# Patient Record
Sex: Male | Born: 1995 | Race: White | Hispanic: No | Marital: Single | Smoking: Never smoker
Health system: Southern US, Community
[De-identification: ages and names within clinical notes are randomized; demographics above are authoritative.]

## PROBLEM LIST (undated history)

## (undated) DIAGNOSIS — F909 Attention-deficit hyperactivity disorder, unspecified type: Secondary | ICD-10-CM

## (undated) DIAGNOSIS — F419 Anxiety disorder, unspecified: Secondary | ICD-10-CM

## (undated) HISTORY — DX: Attention-deficit hyperactivity disorder, unspecified type: F90.9

## (undated) HISTORY — DX: Anxiety disorder, unspecified: F41.9

---
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
## 2016-06-13 ENCOUNTER — Encounter (HOSPITAL_COMMUNITY)
Admission: EM | Disposition: A | Discharge status: Home / Self Care | Payer: Self-pay | Source: Home / Self Care | Attending: Emergency Medicine

## 2016-06-13 ENCOUNTER — Ambulatory Visit (HOSPITAL_COMMUNITY)
Admission: EM | Admit: 2016-06-13 | Discharge: 2016-06-14 | Disposition: A | Discharge status: Home / Self Care | Payer: Managed Care, Other (non HMO) | Attending: Emergency Medicine | Admitting: Emergency Medicine

## 2016-06-13 ENCOUNTER — Encounter (HOSPITAL_COMMUNITY): Payer: Self-pay | Admitting: Emergency Medicine

## 2016-06-13 ENCOUNTER — Emergency Department (HOSPITAL_COMMUNITY): Payer: Managed Care, Other (non HMO)

## 2016-06-13 DIAGNOSIS — F17290 Nicotine dependence, other tobacco product, uncomplicated: Secondary | ICD-10-CM | POA: Insufficient documentation

## 2016-06-13 DIAGNOSIS — S63054A Dislocation of other carpometacarpal joint of right hand, initial encounter: Secondary | ICD-10-CM | POA: Diagnosis present

## 2016-06-13 DIAGNOSIS — F909 Attention-deficit hyperactivity disorder, unspecified type: Secondary | ICD-10-CM | POA: Diagnosis not present

## 2016-06-13 DIAGNOSIS — W228XXA Striking against or struck by other objects, initial encounter: Secondary | ICD-10-CM | POA: Diagnosis not present

## 2016-06-13 HISTORY — PX: PERCUTANEOUS PINNING: SHX2209

## 2016-06-13 LAB — I-STAT CHEM 8, ED
BUN: 5 mg/dL — ABNORMAL LOW (ref 6–20)
CREATININE: 0.9 mg/dL (ref 0.61–1.24)
Calcium, Ion: 1.11 mmol/L — ABNORMAL LOW (ref 1.15–1.40)
Chloride: 103 mmol/L (ref 101–111)
GLUCOSE: 100 mg/dL — AB (ref 65–99)
HEMATOCRIT: 47 % (ref 39.0–52.0)
HEMOGLOBIN: 16 g/dL (ref 13.0–17.0)
Potassium: 3.6 mmol/L (ref 3.5–5.1)
Sodium: 140 mmol/L (ref 135–145)
TCO2: 25 mmol/L (ref 0–100)

## 2016-06-13 LAB — CBC WITH DIFFERENTIAL/PLATELET
BASOS ABS: 0 10*3/uL (ref 0.0–0.1)
Basophils Relative: 0 %
EOS ABS: 0.1 10*3/uL (ref 0.0–0.7)
EOS PCT: 1 %
HCT: 44.8 % (ref 39.0–52.0)
Hemoglobin: 16.1 g/dL (ref 13.0–17.0)
Lymphocytes Relative: 39 %
Lymphs Abs: 3.3 10*3/uL (ref 0.7–4.0)
MCH: 30.3 pg (ref 26.0–34.0)
MCHC: 35.9 g/dL (ref 30.0–36.0)
MCV: 84.4 fL (ref 78.0–100.0)
MONO ABS: 0.6 10*3/uL (ref 0.1–1.0)
MONOS PCT: 7 %
NEUTROS ABS: 4.5 10*3/uL (ref 1.7–7.7)
Neutrophils Relative %: 53 %
PLATELETS: 361 10*3/uL (ref 150–400)
RBC: 5.31 MIL/uL (ref 4.22–5.81)
RDW: 12.6 % (ref 11.5–15.5)
WBC: 8.5 10*3/uL (ref 4.0–10.5)

## 2016-06-13 LAB — ETHANOL: Alcohol, Ethyl (B): 115 mg/dL — ABNORMAL HIGH (ref <5)

## 2016-06-13 SURGERY — PINNING, EXTREMITY, PERCUTANEOUS
Anesthesia: General | Site: Hand | Laterality: Right

## 2016-06-13 MED ORDER — MIDAZOLAM HCL 2 MG/2ML IJ SOLN
INTRAMUSCULAR | Status: AC
Start: 2016-06-13 — End: 2016-06-13
  Filled 2016-06-13: qty 2

## 2016-06-13 MED ORDER — LIDOCAINE 2% (20 MG/ML) 5 ML SYRINGE
INTRAMUSCULAR | Status: AC
  Filled 2016-06-13: qty 5

## 2016-06-13 MED ORDER — PROPOFOL 10 MG/ML IV BOLUS
INTRAVENOUS | Status: AC
  Filled 2016-06-13: qty 20

## 2016-06-13 MED ORDER — SODIUM CHLORIDE 0.9 % IJ SOLN
INTRAMUSCULAR | Status: AC
  Filled 2016-06-13: qty 10

## 2016-06-13 MED ORDER — SUFENTANIL CITRATE 50 MCG/ML IV SOLN
INTRAVENOUS | Status: AC
  Filled 2016-06-13: qty 1

## 2016-06-13 MED ORDER — IBUPROFEN 400 MG PO TABS
ORAL_TABLET | ORAL | Status: AC
  Filled 2016-06-13: qty 1

## 2016-06-13 MED ORDER — SUCCINYLCHOLINE CHLORIDE 200 MG/10ML IV SOSY
PREFILLED_SYRINGE | INTRAVENOUS | Status: AC
  Filled 2016-06-13: qty 10

## 2016-06-13 MED ORDER — ONDANSETRON HCL 4 MG/2ML IJ SOLN
INTRAMUSCULAR | Status: AC
  Filled 2016-06-13: qty 2

## 2016-06-13 MED ORDER — IBUPROFEN 400 MG PO TABS
400.0000 mg | ORAL_TABLET | Freq: Once | ORAL | Status: AC | PRN
  Administered 2016-06-13: 400 mg via ORAL

## 2016-06-13 SURGICAL SUPPLY — 20 items
BANDAGE ACE 4X5 VEL STRL LF (GAUZE/BANDAGES/DRESSINGS) ×4 IMPLANT
GAUZE SPONGE 4X4 12PLY STRL (GAUZE/BANDAGES/DRESSINGS) ×3 IMPLANT
GLOVE BIOGEL M 8.0 STRL (GLOVE) ×3 IMPLANT
GOWN STRL REUS W/ TWL LRG LVL3 (GOWN DISPOSABLE) ×2 IMPLANT
GOWN STRL REUS W/TWL LRG LVL3 (GOWN DISPOSABLE) ×6
KIT BASIN OR (CUSTOM PROCEDURE TRAY) ×3 IMPLANT
KIT ROOM TURNOVER OR (KITS) ×3 IMPLANT
NDL HYPO 25GX1X1/2 BEV (NEEDLE) IMPLANT
NEEDLE HYPO 25GX1X1/2 BEV (NEEDLE) ×6 IMPLANT
PACK ORTHO EXTREMITY (CUSTOM PROCEDURE TRAY) ×3 IMPLANT
PAD ARMBOARD 7.5X6 YLW CONV (MISCELLANEOUS) ×4 IMPLANT
PAD CAST 4YDX4 CTTN HI CHSV (CAST SUPPLIES) IMPLANT
PADDING CAST COTTON 4X4 STRL (CAST SUPPLIES) ×6
SLING ARM FOAM STRAP LRG (SOFTGOODS) ×2 IMPLANT
SOAP 2 % CHG 4 OZ (WOUND CARE) ×3 IMPLANT
SPLINT FIBERGLASS 4X30 (CAST SUPPLIES) ×2 IMPLANT
SPONGE LAP 4X18 X RAY DECT (DISPOSABLE) ×3 IMPLANT
TOWEL OR 17X24 6PK STRL BLUE (TOWEL DISPOSABLE) ×3 IMPLANT
TUBE CONNECTING 12'X1/4 (SUCTIONS) ×1
TUBE CONNECTING 12X1/4 (SUCTIONS) ×2 IMPLANT

## 2016-06-13 NOTE — Anesthesia Preprocedure Evaluation (Addendum)
Anesthesia Evaluation  Patient identified by MRN, date of birth, ID band Patient awake    Reviewed: Allergy & Precautions, NPO status , Patient's Chart, lab work & pertinent test results  Airway Mallampati: I  TM Distance: >3 FB Neck ROM: Full    Dental   Pulmonary    Pulmonary exam normal        Cardiovascular Normal cardiovascular exam     Neuro/Psych Anxiety    GI/Hepatic   Endo/Other    Renal/GU      Musculoskeletal   Abdominal   Peds  Hematology   Anesthesia Other Findings   Reproductive/Obstetrics                             Anesthesia Physical Anesthesia Plan  ASA: II and emergent  Anesthesia Plan: General   Post-op Pain Management:  Regional for Post-op pain   Induction: Intravenous, Rapid sequence and Cricoid pressure planned  Airway Management Planned: Oral ETT  Additional Equipment:   Intra-op Plan:   Post-operative Plan: Extubation in OR  Informed Consent: I have reviewed the patients History and Physical, chart, labs and discussed the procedure including the risks, benefits and alternatives for the proposed anesthesia with the patient or authorized representative who has indicated his/her understanding and acceptance.     Plan Discussed with: CRNA and Surgeon  Anesthesia Plan Comments:        Anesthesia Quick Evaluation

## 2016-06-13 NOTE — ED Notes (Signed)
Pt prefers to NOT have any opiates.

## 2016-06-13 NOTE — ED Triage Notes (Signed)
Pt st's he has been drinking alcohol since this am.  Pt st's he got upset and hit a wall   Pt has deformity to right hand

## 2016-06-13 NOTE — ED Notes (Signed)
Patient transported to X-ray 

## 2016-06-13 NOTE — ED Provider Notes (Signed)
[facility]-EMERGENCY DEPT Provider Note   CSN: [id number] Arrival date & time: 06/13/16  1745     History   Chief Complaint Chief Complaint  Patient presents with  . Hand Injury    HPI Hunter Dawson is a 21 y.o. male.  HPI   Hunter Dawson is a 21 y.o. male, with a history of Anxiety, presenting to the ED with right hand injury. Patient states that he punched a wall around 5 PM today. He states he has been drinking alcohol all day and got upset, which made him a wall. His pain is centered around the posterior right wrist. Patient's pain was initially 10 out of 10, but has been reduced to 6 out of 10 with ibuprofen. He denies illicit drug use. He denies SI or HI. Endorses some paresthesias to the right hand. He denies other injuries, LOC, falls, or any other complaints.  Last food was around noon today.  Past Medical History:  Diagnosis Date  . ADHD (attention deficit hyperactivity disorder)   . Anxiety     Patient Active Problem List   Diagnosis Date Noted  . ADD (attention deficit disorder) 02/18/2013  . Generalized anxiety disorder 02/18/2013    History reviewed. No pertinent surgical history.     Home Medications    Prior to Admission medications   Medication Sig Start Date End Date Taking? Authorizing Provider  sertraline (ZOLOFT) 50 MG tablet Take 1 tablet (50 mg total) by mouth daily. 02/14/13 02/14/14  Loree Fee, PA-C    Family History No family history on file.  Social History Social History  Substance Use Topics  . Smoking status: Never Smoker  . Smokeless tobacco: Current User  . Alcohol use Yes     Allergies   Other   Review of Systems Review of Systems  Musculoskeletal: Positive for arthralgias and joint swelling.  Neurological: Negative for weakness.     Physical Exam Updated Vital Signs BP (!) 155/107 (BP Location: Left Arm)   Pulse (!) 102   Temp 97.8 F (36.6 C) (Oral)   Resp 20   Ht  (1.753 m)   Wt 79.4 kg   SpO2  99%   BMI 25.84 kg/m   Physical Exam  Constitutional: He appears well-developed and well-nourished. No distress.  HENT:  Head: Normocephalic and atraumatic.  Eyes: Conjunctivae are normal.  Neck: Neck supple.  Cardiovascular: Normal rate, regular rhythm and intact distal pulses.   Pulmonary/Chest: Effort normal.  Musculoskeletal: He exhibits edema and tenderness.  Tenderness and swelling to the anterior right wrist into the proximal right hand. Range of motion in the right wrist limited by pain and swelling.  Neurological: He is alert.  Right hand: Patient can make a fist and fully extend and flex his fingers. He can touch his thumb to each finger tip, except for the small finger. He has some difficulty adducting and abducting his fingers, which he states is due to pain. He endorses some paresthesias to the fingertips of each finger in his right hand.  Skin: Skin is warm and dry. Capillary refill takes less than 2 seconds. He is not diaphoretic.  Psychiatric: He has a normal mood and affect. His behavior is normal.  Nursing note and vitals reviewed.    ED Treatments / Results  Labs (all labs ordered are listed, but only abnormal results are displayed) Labs Reviewed  ETHANOL - Abnormal; Notable for the following:       Result Value   Alcohol, Ethyl (B)  115 (*)    All other components within normal limits  I-STAT CHEM 8, ED - Abnormal; Notable for the following:    BUN 5 (*)    Glucose, Bld 100 (*)    Calcium, Ion 1.11 (*)    All other components within normal limits  CBC WITH DIFFERENTIAL/PLATELET    EKG  EKG Interpretation None       Radiology Dg Wrist Complete Right  Result Date: 06/13/2016 CLINICAL DATA:  Hit a wall with pain and swelling EXAM: RIGHT WRIST - COMPLETE 3+ VIEW COMPARISON:  None. FINDINGS: Questionable old fifth metacarpal deformity. No acute fracture is seen. Apparent dorsal subluxation of the second through fifth metacarpals with respect to the carpal  bones. IMPRESSION: Abnormal alignment at the second through fifth CMC joints with metacarpals positioned dorsal to the carpal bones. Electronically Signed   By: Jasmine Pang M.D.   On: 06/13/2016 20:11   Dg Hand Complete Right  Result Date: 06/13/2016 CLINICAL DATA:  Hit a wall with wrist and hand pain and swelling EXAM: RIGHT HAND - COMPLETE 3+ VIEW COMPARISON:  None. FINDINGS: Abnormal alignment at the second through fifth CMC joints. There is dorsal dislocation of the second through fifth metacarpals with respect to the carpal bones. IMPRESSION: Apparent dorsal dislocation at the second through fifth [facility] joints Electronically Signed   By: Jasmine Pang M.D.   On: 06/13/2016 20:13    Procedures Procedures (including critical care time)  Medications Ordered in ED Medications  ibuprofen (ADVIL,MOTRIN) tablet 400 mg (400 mg Oral Given 06/13/16 1828)     Initial Impression / Assessment and Plan / ED Course  I have reviewed the triage vital signs and the nursing notes.  Pertinent labs & imaging results that were available during my care of the patient were reviewed by me and considered in my medical decision making (see chart for details).  Clinical Course as of Jun 13 2309  Sun Jun 13, 2016  2138 Spoke with Dr. Izora Ribas, who states he can come down to reduce the hand if we can do the conscious sedation. Told him we would need to draw an ethanol level first. Dr. Izora Ribas asks for [place] to call him back when we decide if we can do the sedation.  [SJ]  2225 Spoke with Dr. Izora Ribas again, who states he will be taking the patient to the OR for reduction. Requests that we prep the patient and obtain basic labs.  [SJ]    Clinical Course User Index [SJ] Anselm Pancoast, PA-C    Patient presents with a right hand injury. Dislocation of CMC joints 2 through 5. Hand consult. Patient to OR.  Vitals:   06/13/16 1937 06/13/16 1945 06/13/16 2000 06/13/16 2051  BP:  123/81 (!) 135/95 (!) 134/93  Pulse:  94 (!) 103  97  Resp:   18 16  Temp:      TempSrc:      SpO2: 99% 97% 98% 98%  Weight:      Height:         Final Clinical Impressions(s) / ED Diagnoses   Final diagnoses:  Dislocation of carpometacarpal joint of right hand, initial encounter    New Prescriptions New Prescriptions   No medications on file     Concepcion Living 06/13/16 2311    Lavera Guise, MD 06/13/16 [phone]

## 2016-06-14 ENCOUNTER — Encounter (HOSPITAL_COMMUNITY): Payer: Self-pay | Admitting: General Surgery

## 2016-06-14 ENCOUNTER — Emergency Department (HOSPITAL_COMMUNITY): Payer: Managed Care, Other (non HMO) | Admitting: Anesthesiology

## 2016-06-14 MED ORDER — PROPOFOL 10 MG/ML IV BOLUS
INTRAVENOUS | Status: DC | PRN
  Administered 2016-06-14: 120 mg via INTRAVENOUS

## 2016-06-14 MED ORDER — BUPIVACAINE HCL (PF) 0.25 % IJ SOLN
INTRAMUSCULAR | Status: AC
Start: 2016-06-14 — End: 2016-06-14
  Filled 2016-06-14: qty 30

## 2016-06-14 MED ORDER — PROPOFOL 1000 MG/100ML IV EMUL
INTRAVENOUS | Status: AC
  Filled 2016-06-14: qty 100

## 2016-06-14 MED ORDER — DOUBLE ANTIBIOTIC 500-10000 UNIT/GM EX OINT
TOPICAL_OINTMENT | CUTANEOUS | Status: AC
  Filled 2016-06-14: qty 1

## 2016-06-14 MED ORDER — LACTATED RINGERS IV SOLN
INTRAVENOUS | Status: DC | PRN
  Administered 2016-06-13: via INTRAVENOUS

## 2016-06-14 MED ORDER — ONDANSETRON HCL 4 MG/2ML IJ SOLN
INTRAMUSCULAR | Status: DC | PRN
  Administered 2016-06-14: 4 mg via INTRAVENOUS

## 2016-06-14 MED ORDER — TRAMADOL HCL 50 MG PO TABS: 100.0000 mg | ORAL_TABLET | Freq: Four times a day (QID) | ORAL | 0 refills | Status: DC | PRN

## 2016-06-14 MED ORDER — BUPIVACAINE-EPINEPHRINE (PF) 0.5% -1:200000 IJ SOLN
INTRAMUSCULAR | Status: DC | PRN
  Administered 2016-06-14: 30 mL via PERINEURAL

## 2016-06-14 MED ORDER — SUCCINYLCHOLINE CHLORIDE 20 MG/ML IJ SOLN
INTRAMUSCULAR | Status: DC | PRN
  Administered 2016-06-14: 140 mg via INTRAVENOUS

## 2016-06-14 MED ORDER — LACTATED RINGERS IV SOLN: INTRAVENOUS | Status: DC | PRN

## 2016-06-14 MED ORDER — MIDAZOLAM HCL 2 MG/2ML IJ SOLN
INTRAMUSCULAR | Status: DC | PRN
  Administered 2016-06-14: 2 mg via INTRAVENOUS

## 2016-06-14 NOTE — Anesthesia Postprocedure Evaluation (Signed)
Anesthesia Post Note  Patient: Hunter Dawson  Procedure(s) Performed: Procedure(s) (LRB): CLOSED REDUCTION RIGHT HAND (Right)  Patient location during evaluation: PACU Anesthesia Type: General Level of consciousness: awake and alert Pain management: pain level controlled Vital Signs Assessment: post-procedure vital signs reviewed and stable Respiratory status: spontaneous breathing, nonlabored ventilation, respiratory function stable and patient connected to nasal cannula oxygen Cardiovascular status: blood pressure returned to baseline and stable Postop Assessment: no signs of nausea or vomiting Anesthetic complications: no       Last Vitals:  Vitals:   06/14/16 0151 06/14/16 0204  BP: (!) 142/99 127/78  Pulse: (!) 116 (!) 113  Resp: (!) 24 20  Temp: 37.6 C 37.4 C    Last Pain:  Vitals:   06/13/16 2049  TempSrc:   PainSc: 5                  Lela Murfin DAVID

## 2016-06-14 NOTE — Discharge Instructions (Signed)
Discharge Instructions:  Keep your dressing clean, dry and in place until instructed to remove by Dr. Izora Ribas.  If the dressing becomes dirty or wet call the office for instructions during business hours. Elevate the extremity to help with swelling, this will also help with any discomfort. Take your medication as prescribed. No lifting with the injured  extremity. If you feel that the dressing is too tight, you may loosen it, but keep it on; finger tips should be pink; if there is a concern, call the office. [phone] Ice may be used if the injury is a fracture, do not apply ice directly to the skin. Please call the office on the next business day after discharge to arrange a follow up appointment.  Call [phone] between the hours of 9am - 5pm M-Th or 9am - 1pm on Fri. For most hand injuries and/or conditions, you may return to work using the uninjured hand (one handed duty) within 24-72 hours.  A detailed note will be provided to you at your follow up appointment or may contact the office prior to your follow up.   Cast or Splint Care, Adult Casts and splints are supports that are worn to protect broken bones and other injuries. A cast or splint may hold a bone still and in the correct position while it heals. Casts and splints may also help to ease pain, swelling, and muscle spasms. How to care for your cast  Do not stick anything inside the cast to scratch your skin.  Check the skin around the cast every day. Tell your doctor about any concerns.  You may put lotion on dry skin around the edges of the cast. Do not put lotion on the skin under the cast.  Keep the cast clean.  If the cast is not waterproof:  Do not let it get wet.  Cover it with a watertight covering when you take a bath or a shower. How to care for your splint  Wear it as told by your doctor. Take it off only as told by your doctor.  Loosen the splint if your fingers or toes tingle, get numb, or turn cold  and blue.  Keep the splint clean.  If the splint is not waterproof:  Do not let it get wet.  Cover it with a watertight covering when you take a bath or a shower. Follow these instructions at home: Bathing   Do not take baths or swim until your doctor says it is okay. Ask your doctor if you can take showers. You may only be allowed to take sponge baths for bathing.  If your cast or splint is not waterproof, cover it with a watertight covering when you take a bath or shower. Managing pain, stiffness, and swelling   Move your fingers or toes often to avoid stiffness and to lessen swelling.  Raise (elevate) the injured area above the level of your heart while sitting or lying down. Safety   Do not use the injured limb to support your body weight until your doctor says that it is okay.  Use crutches or other assistive devices as told by your doctor. General instructions   Do not put pressure on any part of the cast or splint until it is fully hardened. This may take many hours.  Return to your normal activities as told by your doctor. Ask your doctor what activities are safe for you.  Keep all follow-up visits as told by your doctor. This is important.  Contact a doctor if:  Your cast or splint gets damaged.  The skin around the cast gets red or raw.  The skin under the cast is very itchy or painful.  Your cast or splint feels very uncomfortable.  Your cast or splint is too tight or too loose.  Your cast becomes wet or it starts to have a soft spot or area.  You get an object stuck under your cast. Get help right away if:  Your pain gets worse.  The injured area tingles, gets numb, or turns blue and cold.  The part of your body above or below the cast is swollen and it turns a different color (is discolored).  You cannot feel or move your fingers or toes.  There is fluid leaking through the cast.  You have very bad pain or pressure under the cast.  You have  trouble breathing.  You have shortness of breath.  You have chest pain. This information is not intended to replace advice given to you by your health care provider. Make sure you discuss any questions you have with your health care provider. Document Released: 06/17/2010 Document Revised: 02/06/2016 Document Reviewed: 02/06/2016 Elsevier Interactive Patient Education  2017 [employer]    Regional Anesthesia Blocks  1. Numbness or the inability to move the "blocked" extremity may last from 3-48 hours after placement. The length of time depends on the medication injected and your individual response to the medication. If the numbness is not going away after 48 hours, call your surgeon.  2. The extremity that is blocked will need to be protected until the numbness is gone and the  Strength has returned. Because you cannot feel it, you will need to take extra care to avoid injury. Because it may be weak, you may have difficulty moving it or using it. You may not know what position it is in without looking at it while the block is in effect.  3. For blocks in the legs and feet, returning to weight bearing and walking needs to be done carefully. You will need to wait until the numbness is entirely gone and the strength has returned. You should be able to move your leg and foot normally before you try and bear weight or walk. You will need someone to be with you when you first try to ensure you do not fall and possibly risk injury.  4. Bruising and tenderness at the needle site are common side effects and will resolve in a few days.  5. Persistent numbness or new problems with movement should be communicated to the surgeon or the [facility] [phone]

## 2016-06-14 NOTE — Transfer of Care (Signed)
Immediate Anesthesia Transfer of Care Note  Patient: Hunter Dawson  Procedure(s) Performed: Procedure(s): PERCUTANEOUS PINNING EXTREMITY (Right)  Patient Location: PACU  Anesthesia Type:General and GA combined with regional for post-op pain  Level of Consciousness: awake, alert  and oriented  Airway & Oxygen Therapy: Patient Spontanous Breathing and Patient connected to face mask oxygen  Post-op Assessment: Report given to RN and Post -op Vital signs reviewed and stable  Post vital signs: Reviewed and stable  Last Vitals:  Vitals:   06/13/16 2000 06/13/16 2051  BP: (!) 135/95 (!) 134/93  Pulse: (!) 103 97  Resp: 18 16  Temp:      Last Pain:  Vitals:   06/13/16 2049  TempSrc:   PainSc: 5          Complications: No apparent anesthesia complications

## 2016-06-14 NOTE — Op Note (Signed)
NAME:  Hunter Dawson, Hunter Dawson NO.:  [id number]  MEDICAL RECORD NO.:  [id number]  LOCATION:  [facility]                         FACILITY:  [facility]  PHYSICIAN:  Johnette Abraham, MD    DATE OF BIRTH:  1995/03/21  DATE OF PROCEDURE:  06/14/2016 DATE OF DISCHARGE:                              OPERATIVE REPORT   PREOPERATIVE DIAGNOSIS:  Closed dislocation of the right 2nd, 3rd, 4th, and 5th carpometacarpal joints.  POSTOPERATIVE DIAGNOSIS:  Closed dislocation of the right 2nd, 3rd, 4th, and 5th carpometacarpal joints.  PROCEDURE:  Closed reduction of the right 2nd, right 3rd, right 4th, and right 5th carpometacarpal dislocations.  ANESTHESIA:  General.  COMPLICATIONS:  No acute complications.  INDICATIONS:  [name] is a 21 year old male, who punched a wall this afternoon sustaining a dislocation of his right 2nd, 3rd, 4th, and 5th carpometacarpal joints.  He was seen in the emergency room.  Risks, benefits, and alternatives of closed reduction, possible open reduction and pinning were discussed with the patient.  He agreed with this course of action.  Consent was obtained.  DESCRIPTION OF PROCEDURE:  The patient was taken to the operating room, placed supine on the operating room table.  Of note, the patient received an interscalene block in the preop holding by the Anesthesia Service.  He was placed supine on the operating room table.  Anesthesia was administered.  A time-out was performed identifying the correct procedure and extremity being the right.  The right upper extremity was prepped and draped in the normal sterile fashion.  The in-line traction was then placed on the 2nd, 3rd, 4th, and 5th fingers while dorsal pressure of the carpometacarpal joint was performed and with several thunks, the joints were reduced.  Postoperative x-rays revealed near anatomic reduction.  No obvious comminuted fractures.  Gentle manipulation of the fingers.  The reduction felt stable  and therefore, no K-wires were placed.  The patient was then placed in a splint to maintain reduction.  The patient tolerated the procedure well.  Awoke from anesthesia and taken to recovery room stable.     Johnette Abraham, MD     HCC/MEDQ  D:  06/14/2016  T:  06/14/2016  Job:  [id number]

## 2016-06-14 NOTE — Anesthesia Procedure Notes (Signed)
Procedure Name: Intubation Date/Time: 06/14/2016 12:47 AM Performed by: Mosie Epstein Pre-anesthesia Checklist: Patient identified, Emergency Drugs available, Suction available, Patient being monitored and Timeout performed Patient Re-evaluated:Patient Re-evaluated prior to inductionOxygen Delivery Method: Circle system utilized Preoxygenation: Pre-oxygenation with 100% oxygen Intubation Type: IV induction, Rapid sequence and Cricoid Pressure applied Laryngoscope Size: Mac and 4 Grade View: Grade I Tube type: Oral Tube size: 7.5 mm Number of attempts: 1 Airway Equipment and Method: Stylet Placement Confirmation: ETT inserted through vocal cords under direct vision,  positive ETCO2 and breath sounds checked- equal and bilateral Secured at: 23 cm Tube secured with: Tape Dental Injury: Teeth and Oropharynx as per pre-operative assessment

## 2016-06-14 NOTE — Anesthesia Procedure Notes (Signed)
Anesthesia Regional Block: Supraclavicular block   Pre-Anesthetic Checklist: ,, timeout performed, Correct Patient, Correct Site, Correct Laterality, Correct Procedure, Correct Position, site marked, Risks and benefits discussed,  Surgical consent,  Pre-op evaluation,  At surgeon's request and post-op pain management  Laterality: Right  Prep: chloraprep       Needles:   Needle Type: Echogenic Stimulator Needle     Needle Length: 9cm      Additional Needles:   Procedures: ultrasound guided, nerve stimulator,,,,,,   Nerve Stimulator or Paresthesia:  Response: 0.4 mA,   Additional Responses:   Narrative:  Start time: 06/14/2016 12:25 AM End time: 06/14/2016 12:35 AM Injection made incrementally with aspirations every 5 mL.  Performed by: Personally  Anesthesiologist: Arta Bruce  Additional Notes: Monitors applied. Patient sedated. Sterile prep and drape,hand hygiene and sterile gloves were used. Relevant anatomy identified.Needle position confirmed.Local anesthetic injected incrementally after negative aspiration. Local anesthetic spread visualized around nerve(s). Vascular puncture avoided. No complications. Image printed for medical record.The patient tolerated the procedure well.
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
## 2017-07-15 ENCOUNTER — Inpatient Hospital Stay (HOSPITAL_COMMUNITY)
Admission: AD | Admit: 2017-07-15 | Discharge: 2017-07-17 | DRG: 885 | Disposition: A | Discharge status: Home / Self Care | Payer: Managed Care, Other (non HMO) | Source: Intra-hospital | Attending: Psychiatry | Admitting: Psychiatry

## 2017-07-15 ENCOUNTER — Emergency Department (HOSPITAL_COMMUNITY)
Admission: EM | Admit: 2017-07-15 | Discharge: 2017-07-15 | Disposition: A | Discharge status: Other Acute Inpatient Hospital | Payer: Managed Care, Other (non HMO) | Attending: Emergency Medicine | Admitting: Emergency Medicine

## 2017-07-15 ENCOUNTER — Other Ambulatory Visit: Payer: Self-pay

## 2017-07-15 ENCOUNTER — Encounter (HOSPITAL_COMMUNITY): Payer: Self-pay

## 2017-07-15 DIAGNOSIS — F419 Anxiety disorder, unspecified: Secondary | ICD-10-CM | POA: Diagnosis not present

## 2017-07-15 DIAGNOSIS — F1010 Alcohol abuse, uncomplicated: Secondary | ICD-10-CM | POA: Insufficient documentation

## 2017-07-15 DIAGNOSIS — Z9114 Patient's other noncompliance with medication regimen: Secondary | ICD-10-CM

## 2017-07-15 DIAGNOSIS — F411 Generalized anxiety disorder: Secondary | ICD-10-CM | POA: Diagnosis present

## 2017-07-15 DIAGNOSIS — F332 Major depressive disorder, recurrent severe without psychotic features: Secondary | ICD-10-CM | POA: Diagnosis not present

## 2017-07-15 DIAGNOSIS — F988 Other specified behavioral and emotional disorders with onset usually occurring in childhood and adolescence: Secondary | ICD-10-CM | POA: Diagnosis present

## 2017-07-15 DIAGNOSIS — F99 Mental disorder, not otherwise specified: Secondary | ICD-10-CM | POA: Diagnosis present

## 2017-07-15 DIAGNOSIS — F909 Attention-deficit hyperactivity disorder, unspecified type: Secondary | ICD-10-CM | POA: Diagnosis present

## 2017-07-15 DIAGNOSIS — Z79899 Other long term (current) drug therapy: Secondary | ICD-10-CM | POA: Diagnosis not present

## 2017-07-15 DIAGNOSIS — F322 Major depressive disorder, single episode, severe without psychotic features: Secondary | ICD-10-CM | POA: Diagnosis not present

## 2017-07-15 DIAGNOSIS — R45851 Suicidal ideations: Secondary | ICD-10-CM

## 2017-07-15 LAB — COMPREHENSIVE METABOLIC PANEL
ALK PHOS: 42 U/L (ref 38–126)
ALT: 17 U/L (ref 17–63)
ANION GAP: 10 (ref 5–15)
AST: 23 U/L (ref 15–41)
Albumin: 4.7 g/dL (ref 3.5–5.0)
BILIRUBIN TOTAL: 0.8 mg/dL (ref 0.3–1.2)
BUN: 9 mg/dL (ref 6–20)
CO2: 26 mmol/L (ref 22–32)
Calcium: 9.5 mg/dL (ref 8.9–10.3)
Chloride: 104 mmol/L (ref 101–111)
Creatinine, Ser: 0.91 mg/dL (ref 0.61–1.24)
GFR calc Af Amer: >60 mL/min (ref >60)
GLUCOSE: 87 mg/dL (ref 65–99)
Potassium: 4.2 mmol/L (ref 3.5–5.1)
Sodium: 140 mmol/L (ref 135–145)
TOTAL PROTEIN: 8.4 g/dL — AB (ref 6.5–8.1)

## 2017-07-15 LAB — RAPID URINE DRUG SCREEN, HOSP PERFORMED
AMPHETAMINES: NOT DETECTED
Barbiturates: NOT DETECTED
Benzodiazepines: POSITIVE — AB
COCAINE: NOT DETECTED
OPIATES: NOT DETECTED
Tetrahydrocannabinol: POSITIVE — AB

## 2017-07-15 LAB — CBC
HCT: 46.3 % (ref 39.0–52.0)
Hemoglobin: 16.6 g/dL (ref 13.0–17.0)
MCH: 31.4 pg (ref 26.0–34.0)
MCHC: 35.9 g/dL (ref 30.0–36.0)
MCV: 87.7 fL (ref 78.0–100.0)
PLATELETS: 348 10*3/uL (ref 150–400)
RBC: 5.28 MIL/uL (ref 4.22–5.81)
RDW: 12.3 % (ref 11.5–15.5)
WBC: 8.4 10*3/uL (ref 4.0–10.5)

## 2017-07-15 LAB — ACETAMINOPHEN LEVEL

## 2017-07-15 LAB — ETHANOL

## 2017-07-15 LAB — SALICYLATE LEVEL: Salicylate Lvl: <7 mg/dL (ref 2.8–30.0)

## 2017-07-15 MED ORDER — MAGNESIUM HYDROXIDE 400 MG/5ML PO SUSP: 30.0000 mL | Freq: Every day | ORAL | Status: DC | PRN

## 2017-07-15 MED ORDER — ALUM & MAG HYDROXIDE-SIMETH 200-200-20 MG/5ML PO SUSP: 30.0000 mL | ORAL | Status: DC | PRN

## 2017-07-15 MED ORDER — ACETAMINOPHEN 325 MG PO TABS: 650.0000 mg | ORAL_TABLET | Freq: Four times a day (QID) | ORAL | Status: DC | PRN

## 2017-07-15 MED ORDER — DIPHENHYDRAMINE HCL 25 MG PO CAPS
25.0000 mg | ORAL_CAPSULE | Freq: Once | ORAL | Status: AC
  Administered 2017-07-15: 25 mg via ORAL
  Filled 2017-07-15: qty 1

## 2017-07-15 MED ORDER — TRAZODONE HCL 50 MG PO TABS
50.0000 mg | ORAL_TABLET | Freq: Every evening | ORAL | Status: DC | PRN
  Administered 2017-07-16: 50 mg via ORAL
  Filled 2017-07-15: qty 1

## 2017-07-15 MED ORDER — HYDROXYZINE HCL 25 MG PO TABS
25.0000 mg | ORAL_TABLET | Freq: Three times a day (TID) | ORAL | Status: DC | PRN
  Administered 2017-07-16 (×3): 25 mg via ORAL
  Filled 2017-07-15 (×3): qty 1

## 2017-07-15 NOTE — ED Notes (Signed)
ED Provider at bedside. 

## 2017-07-15 NOTE — ED Notes (Signed)
Patient more calm at this point since his visitors left the unit. Pt talking with staff members and is cooperative at this time. No distress noted.

## 2017-07-15 NOTE — ED Notes (Signed)
TTS AT BEDSIDE 

## 2017-07-15 NOTE — ED Notes (Signed)
Patient sitting up in bed and is talking to his visitor. Pt pleasant and cooperative with care. No distress noted.
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
## 2017-07-15 NOTE — ED Notes (Signed)
Attempted to call report to nurse at [facility], who is unavailable at this time. Will call back when able to take report.

## 2017-07-15 NOTE — ED Notes (Signed)
Bed: [facility] Expected date:  Expected time:  Means of arrival:  Comments: Hold for 27 

## 2017-07-15 NOTE — ED Notes (Signed)
Pt tearful and states he is feeling anxious at this time. Pt's sister at bedside and states "None of what his ex girlfriend said is true. She lied and told us last week that Rayn had killed himself and that we needed to come get his body! She is always fabricating stories to upset our family and Tanmay". Dr. Clayborne Dana made aware of patient's anxiety; awaiting orders at this time.

## 2017-07-15 NOTE — ED Notes (Signed)
Bed: [facility]27 Expected date:  Expected time:  Means of arrival:  Comments: Sheriff-IVC 
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
## 2017-07-15 NOTE — ED Notes (Signed)
Report called to Toby at [facility]. Per Dickie La, RN, police transportation to be called after 2200 to move patient.

## 2017-07-15 NOTE — BH Assessment (Signed)
[facility] Assessment Progress Note  Per Nanine Means, DNP, this pt requires psychiatric hospitalization.  Malva Limes, RN, [facility] has assigned pt to [facility] Rm 303-2; [facility] will be ready to receive pt after 18:00, and after pt is medically cleared.  Pt presents under IVC initiated by pt's girlfriend, and upheld by EDP Marily Memos, MD, and IVC documents have been faxed to [facility].  Pt's nurse, Morrie Sheldon, has been notified, and agrees to call report to [phone].  Pt is to be transported via [employer].   Doylene Canning, [place] Behavioral Health Coordinator [phone]

## 2017-07-15 NOTE — ED Notes (Signed)
Family at bedside. 

## 2017-07-15 NOTE — ED Notes (Signed)
Transportation requested for patient at this time.

## 2017-07-16 ENCOUNTER — Encounter (HOSPITAL_COMMUNITY): Payer: Self-pay

## 2017-07-16 ENCOUNTER — Other Ambulatory Visit: Payer: Self-pay

## 2017-07-16 DIAGNOSIS — F322 Major depressive disorder, single episode, severe without psychotic features: Principal | ICD-10-CM

## 2017-07-16 LAB — HEMOGLOBIN A1C
Hgb A1c MFr Bld: 4.7 % — ABNORMAL LOW (ref 4.8–5.6)
Mean Plasma Glucose: 88.19 mg/dL

## 2017-07-16 LAB — LIPID PANEL
CHOL/HDL RATIO: 3.4 ratio
CHOLESTEROL: 168 mg/dL (ref 0–200)
HDL: 50 mg/dL (ref >40)
LDL Cholesterol: 103 mg/dL — ABNORMAL HIGH (ref 0–99)
TRIGLYCERIDES: 74 mg/dL (ref <150)
VLDL: 15 mg/dL (ref 0–40)

## 2017-07-16 LAB — TSH: TSH: 1.926 u[IU]/mL (ref 0.350–4.500)

## 2017-07-16 MED ORDER — NICOTINE 21 MG/24HR TD PT24
21.0000 mg | MEDICATED_PATCH | Freq: Every day | TRANSDERMAL | Status: DC
  Administered 2017-07-16 – 2017-07-17 (×2): 21 mg via TRANSDERMAL
  Filled 2017-07-16 (×4): qty 1

## 2017-07-16 NOTE — BHH Counselor (Signed)
Clinical Social Work Note  At patient request, met with him 1:1 and helped to process his recent break-up with girlfriend.  He was tearful throughout the session, kept coming back to "what if" questions such as "what if I never find another girl?" and "what if she finds somebody better than me?"  Cognitive Behavioral techniques were used with him.  He was encouraged to write a letter tonight to his ex, even if he never gives it to her, just to express his feelings.  He was also encouraged to follow through at discharge with a therapist in order to process not only this loss, but also the social anxiety he reports, rather than relying on only a medicine or alcohol.    He did report he drinks too much, but has been trying to cut back.  He is living with family, who can pick him up at discharge.  He has no outpatient providers currently, but is reluctantly willing to go see a therapist, with the understanding that the therapist can refer him to the practice physician assistant for medicine if needed.  He stated he does not smoke cigarettes, but does use dip, has stopped before and can stop again without assistance from the [facility].  Because it is anticipated patient will discharge tomorrow and spend less than 72 hours in the facility, Psychosocial Assessment was not completed in full.  Selmer Dominion, LCSW 07/16/2017, 4:57 PM

## 2017-07-16 NOTE — [facility] Group Notes (Signed)
LCSW Group Therapy Note  07/16/2017   10:00--11:00am   Type of Therapy and Topic:  Group Therapy: Anger Cues and Responses  Participation Level:  Active   Description of Group:   In this group, patients learned how to recognize the physical, cognitive, emotional, and behavioral responses they have to anger-provoking situations.  They identified a recent time they became angry and how they reacted.  They analyzed how their reaction was possibly beneficial and how it was possibly unhelpful.  The group discussed a variety of healthier coping skills that could help with such a situation in the future.  Deep breathing was practiced briefly.  Therapeutic Goals: 1. Patients will remember their last incident of anger and how they felt emotionally and physically, what their thoughts were at the time, and how they behaved. 2. Patients will identify how their behavior at that time worked for them, as well as how it worked against them. 3. Patients will explore possible new behaviors to use in future anger situations. 4. Patients will learn that anger itself is normal and cannot be eliminated, and that healthier reactions can assist with resolving conflict rather than worsening situations.  Summary of Patient Progress:  The patient shared that his most recent time of anger was just before his hospitalization and said his girlfriend of 6 years cheated on him then lied to get him put into the hospital.  He said he was very embarrassed and wonders what is wrong with him that this would happen.  He also wonders if he will ever have another relationship.  He received much support and encouragement from other patients to take time for himself.  Therapeutic Modalities:   Cognitive Behavioral Therapy  Lynnell Chad

## 2017-07-16 NOTE — Progress Notes (Signed)
Patient did attend the evening speaker AA meeting.  
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
## 2017-07-16 NOTE — Progress Notes (Signed)
D: Sire has said throughout the day that he feels anxious, and he has asked for Vistaril when available. (Prior to first taking it this morning he did ask if taking it would extend his stay.) He said he hasn't been suicidal and that he has been IVC'd here under false pretenses. After speaking with the MD and NP, he said he was told that he would be discharged tomorrow. He has been appropriate on the unit, denying SI/HI/AVH.   A: Nicotine patch and PRN Vistaril given. Q15 safety checks maintained. Support/encouragement offered.  R: Pt remains free from harm and continues with treatment. Will continue to monitor for needs/safety.

## 2017-07-16 NOTE — [facility] Group Notes (Signed)
Identifying Needs   Date:  07/16/2017  Time:  5:12 PM  Type of Therapy:  Nurse Education  Participation Level:  Active  Participation Quality:  Attentive  Affect:  Appropriate  Cognitive:  Alert  Insight:  Appropriate  Engagement in Group:  Engaged  Modes of Intervention:  Education   /  The group focuses around pts and teaching them to identify their needs and then helping them identify what needs they wanted to get met.  Summary of Progress/Problems:  Hunter Dawson 07/16/2017, 5:12 PM

## 2017-07-16 NOTE — BHH Suicide Risk Assessment (Signed)
[facility] Admission Suicide Risk Assessment   Nursing information obtained from:    Demographic factors:  Male, Adolescent or young adult, Caucasian, Access to firearms, Unemployed Current Mental Status:  NA Loss Factors:  Loss of significant relationship Historical Factors:  NA Risk Reduction Factors:  Positive social support  Total Time spent with patient: 30 minutes Principal Problem: Severe major depression, single episode, without psychotic features (HCC) Diagnosis:   Patient Active Problem List   Diagnosis Date Noted  . Severe major depression, single episode, without psychotic features (HCC) [F32.2] 07/15/2017  . ADD (attention deficit disorder) [F98.8] 02/18/2013  . Generalized anxiety disorder [F41.1] 02/18/2013   Subjective Data: Patient is seen and examined.  Patient is a 22 year old male with a reported past psychiatric history significant for anxiety and depression who presented under involuntary commitment yesterday to the [facility] long emergency department.  Apparently his girlfriend had involuntarily committed him.  It stated that the respondent had threatened to kill himself numerous times.  It was reported that he had pointed a gun in his head and threatened to kill himself.  The respondent told the petitioner that when she leaves he would be found in the woods in the back of his house with a gunshot in his head.  He also threatened to harm himself by wrecking his motorcycle.  The patient denied all this information.  He stated that his girlfriend did this in a vindictive manner.  He did admit to problems with anxiety and depression.  He stated he was not suicidal.  He stated he was wanting to contact a local psychiatrist, but it been putting it off.  He also stated he was taking clonazepam at home infrequently for his anxiety.  It was felt that he was a risk to himself and then transferred to our facility.  While in our facility his mother was contacted.  She gave collateral information  to support the patient.  Continued Clinical Symptoms:  Alcohol Use Disorder Identification Test Final Score (AUDIT): 6 The "Alcohol Use Disorders Identification Test", Guidelines for Use in Primary Care, Second Edition.  World Science writer [facility]). Score between 0-7:  no or low risk or alcohol related problems. Score between 8-15:  moderate risk of alcohol related problems. Score between 16-19:  high risk of alcohol related problems. Score 20 or above:  warrants further diagnostic evaluation for alcohol dependence and treatment.   CLINICAL FACTORS:   Severe Anxiety and/or Agitation Depression:   Anhedonia Impulsivity   Musculoskeletal: Strength & Muscle Tone: within normal limits Gait & Station: normal Patient leans: N/A  Psychiatric Specialty Exam: Physical Exam  Constitutional: He is oriented to person, place, and time. He appears well-developed and well-nourished.  HENT:  Head: Normocephalic and atraumatic.  Respiratory: Effort normal.  Musculoskeletal: Normal range of motion.  Neurological: He is alert and oriented to person, place, and time.    ROS  Blood pressure (!) 133/94, pulse (!) 108, temperature 99 F (37.2 C), temperature source Oral, resp. rate 12, height 5' 6.5" (1.689 m), weight 69.9 kg (154 lb), SpO2 99 %.Body mass index is 24.48 kg/m.  General Appearance: Casual  Eye Contact:  Fair  Speech:  Normal Rate  Volume:  Normal  Mood:  Anxious  Affect:  Congruent  Thought Process:  Coherent  Orientation:  Full (Time, Place, and Person)  Thought Content:  Logical  Suicidal Thoughts:  No  Homicidal Thoughts:  No  Memory:  Immediate;   Fair  Judgement:  Fair  Insight:  Fair  Psychomotor Activity:  Increased  Concentration:  Concentration: Fair  Recall:  [employer] of Knowledge:  Fair  Language:  Fair  Akathisia:  Negative  Handed:  Right  AIMS (if indicated):     Assets:  Desire for Improvement Housing Social Support  ADL's:  Intact  Cognition:   WNL  Sleep:  Number of Hours: 4.75      COGNITIVE FEATURES THAT CONTRIBUTE TO RISK:  None    SUICIDE RISK:   Minimal: No identifiable suicidal ideation.  Patients presenting with no risk factors but with morbid ruminations; may be classified as minimal risk based on the severity of the depressive symptoms  PLAN OF CARE: Patient is seen and examined.  Patient is a 22 year old male with the above-stated past psychiatric history who was transferred to our facility for evaluation and stabilization.  The patient denied any suicidal ideation, and stated he felt as though this was a vindictive act of his girlfriend.  We contacted his mother who supported his contention.  He admitted that he had been getting clonazepam off the street.  He stated he was taking it infrequently.  He is not suicidal, homicidal or psychotic.  He does want to contact his psychiatrist as an outpatient to address his depression and anxiety.  We will monitor him over the next 24 hours to make sure he does not go into benzodiazepine withdrawal.  If that all goes well we will plan on discharge tomorrow.  I certify that inpatient services furnished can reasonably be expected to improve the patient's condition.   Antonieta Pert, MD 07/16/2017, 10:45 AM

## 2017-07-16 NOTE — Progress Notes (Signed)
Patient ID: Hunter Dawson, male   DOB: 09-06-1995, 22 y.o.   MRN: [id number] Admission Note  Pt is a 22 year old male admitted onto the [facility] adult unit on an involuntary basis. On admission, Pt was fidgety and appeared to be restless; "this is my first time in a place like this; they say I could leave tomorrow once I see the doctor." Pt at the time of admission denied any suicidal ideation; "I never tried to hurt myself, I never pointed a gun to my head. I feel bad about the breakup but I will never take my own life." Pt admitted to using Xanax and THC however, does not believe he abuses the Xanax; "I only take one a day"-Pt has no prescription for Xanax. Pt is also an occasional drinker (per Pt). Pt endorsed moderate anxiety however, denied depression or AVH. Support, encouragement, and safe environment provided.  15-minute safety checks initiated and continued. Pt remained calm and cooperative through the admission process. Pt remained alert and oriented through the admission process. Pt searched and no contrabands found. Pt was educated on "drug abuse."

## 2017-07-16 NOTE — Tx Team (Signed)
Initial Treatment Plan 07/16/2017 1:50 AM [name] WUJ:[id number]    PATIENT STRESSORS: Marital or family conflict Substance abuse   PATIENT STRENGTHS: Capable of independent living Communication skills Motivation for treatment/growth Physical Health Supportive family/friends   PATIENT IDENTIFIED PROBLEMS: "Need help with my anxiety"  "I want to feel better"  Risk for Suicide  Anxiety  Depression  Ineffective individual coping  Substance Abuse         DISCHARGE CRITERIA:  Ability to meet basic life and health needs Verbal commitment to aftercare and medication compliance Withdrawal symptoms are absent or subacute and managed without 24-hour nursing intervention  PRELIMINARY DISCHARGE PLAN: Return to previous living arrangement  PATIENT/FAMILY INVOLVEMENT: This treatment plan has been presented to and reviewed with the patient, Hunter Dawson, and/or family member.  The patient and family have been given the opportunity to ask questions and make suggestions.  Eveline Keto, RN 07/16/2017, 1:50 AM

## 2017-07-16 NOTE — BHH Suicide Risk Assessment (Signed)
[facility] INPATIENT:  Family/Significant Other Suicide Prevention Education  Suicide Prevention Education:  Education Completed; Mother Atharv Barriere [phone] has been identified by the patient as the family member/significant other with whom the patient will be residing, and identified as the person(s) who will aid the patient in the event of a mental health crisis (suicidal ideations/suicide attempt).  With written consent from the patient, the family member/significant other has been provided the following suicide prevention education, prior to the and/or following the discharge of the patient.  The suicide prevention education provided includes the following:  Suicide risk factors  Suicide prevention and interventions  National Suicide Hotline telephone number  [facility] assessment telephone number  [facility] Emergency Assistance 911  [facility] and/or Residential Mobile Crisis Unit telephone number  Request made of family/significant other to:  Remove weapons (e.g., guns, rifles, knives), all items previously/currently identified as safety concern.    Remove drugs/medications (over-the-counter, prescriptions, illicit drugs), all items previously/currently identified as a safety concern.  The family member/significant other verbalizes understanding of the suicide prevention education information provided.  The family member/significant other agrees to remove the items of safety concern listed above.  Patient owns guns because he hunts, but all guns will be secured until situation has improved.  Carloyn Jaeger Grossman-Orr 07/16/2017, 4:56 PM

## 2017-07-17 DIAGNOSIS — Z79899 Other long term (current) drug therapy: Secondary | ICD-10-CM

## 2017-07-17 MED ORDER — HYDROXYZINE HCL 25 MG PO TABS: 25.0000 mg | ORAL_TABLET | Freq: Three times a day (TID) | ORAL | 0 refills | Status: DC | PRN

## 2017-07-17 MED ORDER — TRAZODONE HCL 50 MG PO TABS: 50.0000 mg | ORAL_TABLET | Freq: Every evening | ORAL | 0 refills | Status: DC | PRN

## 2017-07-17 MED ORDER — DOXYCYCLINE HYCLATE 100 MG PO TABS: 100.0000 mg | ORAL_TABLET | Freq: Two times a day (BID) | ORAL | 0 refills | Status: DC

## 2017-07-17 MED ORDER — DOXYCYCLINE HYCLATE 100 MG PO TABS
100.0000 mg | ORAL_TABLET | Freq: Two times a day (BID) | ORAL | Status: DC
  Administered 2017-07-17: 100 mg via ORAL
  Filled 2017-07-17 (×6): qty 1

## 2017-07-17 NOTE — Progress Notes (Signed)
  [facility] Adult Case Management Discharge Plan :  Will you be returning to the same living situation after discharge:  Yes,  home with parent At discharge, do you have transportation home?: Yes,  mother Do you have the ability to pay for your medications: Yes,  insurance and income  Release of information consent forms completed and turned in to Medical Records by CSW.   Patient to Follow up at: Follow-up Information     [facility] .        [employer] Follow up.   Why:  Social worker will call on Monday to set an appointment with a therapist at this practice, then will call mother Marion Rosenberry at [phone] with the information. Contact information: [facility] [address] [place]  [phone]           Next level of care provider has access to [facility] Link:no  Safety Planning and Suicide Prevention discussed: Yes,  with mother  Have you used any form of tobacco in the last 30 days? (Cigarettes, Smokeless Tobacco, Cigars, and/or Pipes): Yes  Has patient been referred to the Quitline?: Patient refused referral  Patient has been referred for addiction treatment: Yes  Lynnell Chad, LCSW 07/17/2017, 8:30 AM

## 2017-07-17 NOTE — BHH Group Notes (Signed)
[facility] LCSW Group Therapy Note  Date/Time:  07/17/2017  10:00AM-11:00AM  Type of Therapy and Topic:  Group Therapy:  Obstacles at Discharge  Participation Level:  Active   Description of Group: In this process group, members discussed their anticipated obstacles to wellness when they discharge.  We then listened to five different songs that dealt with addiction, depression, and anxiety.  The group discussed how they could relate to each song and how each could help them to stay focused on their wellness.    Therapeutic Goals: 1. Patients will think about and acknowledge the obstacles they think they will face at hospital discharge 2. Patients will be able to realize that they are not alone and others are actually facing similar obstacles 3. Patients will identify how music can help or harm their recovery efforts 4. Patients will explore the use of music as a coping skill  Summary of Patient Progress:  At the beginning of group, patient expressed that his anxiety is going to be a major obstacle to his wellness..  His participation in group included volunteered statements about different songs that he could relate to and found inspiring.  Therapeutic Modalities: Activity Processing   Ambrose Mantle, LCSW

## 2017-07-17 NOTE — BHH Suicide Risk Assessment (Signed)
[facility] Discharge Suicide Risk Assessment   Principal Problem: Severe major depression, single episode, without psychotic features [facility]) Discharge Diagnoses:  Patient Active Problem List   Diagnosis Date Noted  . Severe major depression, single episode, without psychotic features (HCC) [F32.2] 07/15/2017  . ADD (attention deficit disorder) [F98.8] 02/18/2013  . Generalized anxiety disorder [F41.1] 02/18/2013    Total Time spent with patient: 30 minutes  Musculoskeletal: Strength & Muscle Tone: within normal limits Gait & Station: normal Patient leans: N/A  Psychiatric Specialty Exam: Review of Systems  All other systems reviewed and are negative.   Blood pressure 132/85, pulse (!) 143, temperature 98.6 F (37 C), temperature source Oral, resp. rate 20, height 5' 6.5" (1.689 m), weight 69.9 kg (154 lb), SpO2 99 %.Body mass index is 24.48 kg/m.  General Appearance: Casual  Eye Contact::  Good  Speech:  Normal Rate409  Volume:  Normal  Mood:  Euthymic  Affect:  Appropriate  Thought Process:  Coherent  Orientation:  Full (Time, Place, and Person)  Thought Content:  Logical  Suicidal Thoughts:  No  Homicidal Thoughts:  No  Memory:  Immediate;   Good  Judgement:  Intact  Insight:  Fair  Psychomotor Activity:  Normal  Concentration:  Good  Recall:  Good  Fund of Knowledge:Good  Language: Good  Akathisia:  Negative  Handed:  Right  AIMS (if indicated):     Assets:  Communication Skills Desire for Improvement Housing Social Support  Sleep:  Number of Hours: 5  Cognition: WNL  ADL's:  Intact   Mental Status Per Nursing Assessment::   On Admission:  NA  Demographic Factors:  Male and Caucasian  Loss Factors: NA  Historical Factors: Impulsivity  Risk Reduction Factors:   Sense of responsibility to family, Living with another person, especially a relative and Positive social support  Continued Clinical Symptoms:  Alcohol/Substance Abuse/Dependencies  Cognitive  Features That Contribute To Risk:  None    Suicide Risk:  Minimal: No identifiable suicidal ideation.  Patients presenting with no risk factors but with morbid ruminations; may be classified as minimal risk based on the severity of the depressive symptoms  Follow-up Information    [facility] .        [employer] Follow up.   Why:  Social worker will call on Monday to set an appointment with a therapist at this practice, then will call mother Zhane Bluitt at [phone] with the information. Contact information: [facility] [address] [place]  [address] [phone]           Plan Of Care/Follow-up recommendations:  Activity:  ad lib  Antonieta Pert, MD 07/17/2017, 7:59 AM
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
## 2017-07-17 NOTE — Progress Notes (Signed)
Pt discharged home with mom, brother and sister and brother in law. Pt was ambulatory, stable and appreciative at that time. All papers and prescriptions were given and valuables returned. Verbal understanding expressed. Denies SI/HI and A/VH. Pt given opportunity to express concerns and ask questions.

## 2017-07-18 ENCOUNTER — Encounter: Payer: Self-pay | Admitting: Physician Assistant

## 2017-07-18 ENCOUNTER — Ambulatory Visit (INDEPENDENT_AMBULATORY_CARE_PROVIDER_SITE_OTHER): Payer: Managed Care, Other (non HMO) | Admitting: Physician Assistant

## 2017-07-18 ENCOUNTER — Other Ambulatory Visit: Payer: Self-pay

## 2017-07-18 VITALS — BP 122/74 | HR 107 | Temp 98.0°F | Resp 17 | Ht 68.75 in | Wt 156.2 lb

## 2017-07-18 DIAGNOSIS — F411 Generalized anxiety disorder: Secondary | ICD-10-CM | POA: Diagnosis not present

## 2017-07-18 MED ORDER — ALPRAZOLAM 0.5 MG PO TBDP: 0.5000 mg | ORAL_TABLET | Freq: Two times a day (BID) | ORAL | 0 refills | Status: DC | PRN

## 2017-07-18 NOTE — Assessment & Plan Note (Signed)
Long-standing history. No current treatment. Is not taking the Hydroxyzine given by [facility] as he states it makes him feel very nauseated and groggy. Will add to list of intolerances. Discussed counseling as primary treatment. His wishes to proceed with this. Handout given so that he can schedule an appointment. Will start Alprazolam on as needed basis for severe acute anxiety only. Recent TSH unremarkable. Will follow-up 1 month. May need to start SSRI at that time but he declines for now. CSC signed today.  Of note patient with recent [facility] admission due to false report on him of suicidal ideation. Workup was negative and patient was discharged home.

## 2017-07-18 NOTE — Progress Notes (Signed)
Patient presents to clinic today to establish care.  Chronic Issues: Patient with long-standing history of generalized anxiety since adolescence. Was previously treated with Lexapro in high school along with seeing a counselor. Has not been on medication since that time. Endorses worrying about several things throughout the day. Also notes significant social anxiety which impacts the quality of life. Does note work is going well. Also sleeping and eating well. Denies depressed mood or anhedonia. Denies SI/HI. Notes recent assessment at behavioral health due to a report of SI from his ex-girlfriend who he broke up with. States she was being spiteful in doing this and that he was cleared after negative evaluation. Notes prior marijuana use but states he is giving this up.   Health Maintenance: Immunizations -- Unsure of last Tetanus.   Past Medical History:  Diagnosis Date  . ADHD (attention deficit hyperactivity disorder)    Treated with Adderall in middle school. None since that time. did not like the medicaiton  . Anxiety     Past Surgical History:  Procedure Laterality Date  . PERCUTANEOUS PINNING Right 06/13/2016   Procedure: CLOSED REDUCTION RIGHT HAND;  Surgeon: Dayna Barker, MD;  Location: [facility];  Service: Plastics;  Laterality: Right;    No current outpatient medications on file prior to visit.   No current facility-administered medications on file prior to visit.     Allergies  Allergen Reactions  . Lactose Intolerance (Gi)     Stomach discomfort    Family History  Problem Relation Age of Onset  . Arthritis Father   . Heart attack Father   . Carpal tunnel syndrome Father   . Cancer Paternal Grandfather     Social History   Socioeconomic History  . Marital status: Single    Spouse name: Not on file  . Number of children: Not on file  . Years of education: Not on file  . Highest education level: Not on file  Occupational History  . Not on file  Social Needs   . Financial resource strain: Not on file  . Food insecurity:    Worry: Not on file    Inability: Not on file  . Transportation needs:    Medical: Not on file    Non-medical: Not on file  Tobacco Use  . Smoking status: Never Smoker  . Smokeless tobacco: Current User  . Tobacco comment: Dip  Substance and Sexual Activity  . Alcohol use: Yes    Alcohol/week: 3.6 oz    Types: 6 Cans of beer per week  . Drug use: Yes    Types: Marijuana  . Sexual activity: Not Currently    Partners: Female  Lifestyle  . Physical activity:    Days per week: Not on file    Minutes per session: Not on file  . Stress: Not on file  Relationships  . Social connections:    Talks on phone: Not on file    Gets together: Not on file    Attends religious service: Not on file    Active member of club or organization: Not on file    Attends meetings of clubs or organizations: Not on file    Relationship status: Not on file  . Intimate partner violence:    Fear of current or ex partner: Not on file    Emotionally abused: Not on file    Physically abused: Not on file    Forced sexual activity: Not on file  Other Topics Concern  . Not  on file  Social History Narrative  . Not on file   Review of Systems  Constitutional: Negative for chills, fever and malaise/fatigue.  Eyes: Negative for blurred vision and double vision.  Respiratory: Negative for cough and shortness of breath.   Cardiovascular: Negative for chest pain and palpitations.  Gastrointestinal: Negative for abdominal pain, nausea and vomiting.  Musculoskeletal: Negative for myalgias.  Skin: Negative for itching.  Neurological: Negative for dizziness and headaches.  Psychiatric/Behavioral: Positive for substance abuse. Negative for depression, hallucinations, memory loss and suicidal ideas. The patient is nervous/anxious. The patient does not have insomnia.    BP 122/74   Pulse (!) 107   Temp 98 F (36.7 C) (Oral)   Resp 17   Ht 5'  8.75" (1.746 m)   Wt 156 lb 3.2 oz (70.9 kg)   SpO2 95%   BMI 23.23 kg/m   Physical Exam  Constitutional: He is oriented to person, place, and time. He appears well-developed and well-nourished.  HENT:  Head: Normocephalic and atraumatic.  Eyes: Pupils are equal, round, and reactive to light. Conjunctivae are normal.  Neck: Neck supple. No thyromegaly present.  Cardiovascular: Regular rhythm, normal heart sounds and intact distal pulses.  Pulmonary/Chest: Effort normal and breath sounds normal.  Lymphadenopathy:    He has no cervical adenopathy.  Neurological: He is alert and oriented to person, place, and time.  Psychiatric: His speech is normal and behavior is normal. Judgment and thought content normal. His mood appears anxious. His affect is not angry, not blunt, not labile and not inappropriate. He does not exhibit a depressed mood.  Vitals reviewed.   Recent Results (from the past 2160 hour(s))  Rapid urine drug screen (hospital performed)     Status: Abnormal   Collection Time: 07/15/17  1:58 PM  Result Value Ref Range   Opiates NONE DETECTED NONE DETECTED   Cocaine NONE DETECTED NONE DETECTED   Benzodiazepines POSITIVE (A) NONE DETECTED   Amphetamines NONE DETECTED NONE DETECTED   Tetrahydrocannabinol POSITIVE (A) NONE DETECTED   Barbiturates NONE DETECTED NONE DETECTED    Comment: (NOTE) DRUG SCREEN FOR MEDICAL PURPOSES ONLY.  IF CONFIRMATION IS NEEDED FOR ANY PURPOSE, NOTIFY LAB WITHIN 5 DAYS. LOWEST DETECTABLE LIMITS FOR URINE DRUG SCREEN Drug Class                     Cutoff (ng/mL) Amphetamine and metabolites    1000 Barbiturate and metabolites    200 Benzodiazepine                 115 Tricyclics and metabolites     300 Opiates and metabolites        300 Cocaine and metabolites        300 THC                            50 Performed at [facility], [place] [address], [place], [place] [address]   Comprehensive metabolic panel     Status:  Abnormal   Collection Time: 07/15/17  3:11 PM  Result Value Ref Range   Sodium 140 135 - 145 mmol/L   Potassium 4.2 3.5 - 5.1 mmol/L   Chloride 104 101 - 111 mmol/L   CO2 26 22 - 32 mmol/L   Glucose, Bld 87 65 - 99 mg/dL   BUN 9 6 - 20 mg/dL   Creatinine, Ser 0.91 0.61 - 1.24 mg/dL   Calcium  9.5 8.9 - 10.3 mg/dL   Total Protein 8.4 (H) 6.5 - 8.1 g/dL   Albumin 4.7 3.5 - 5.0 g/dL   AST 23 15 - 41 U/L   ALT 17 17 - 63 U/L   Alkaline Phosphatase 42 38 - 126 U/L   Total Bilirubin 0.8 0.3 - 1.2 mg/dL   GFR calc non Af Amer >60 >60 mL/min   GFR calc Af Amer >60 >60 mL/min    Comment: (NOTE) The eGFR has been calculated using the CKD EPI equation. This calculation has not been validated in all clinical situations. eGFR's persistently <60 mL/min signify possible Chronic Kidney Disease.    Anion gap 10 5 - 15    Comment: Performed at [facility], [place] [address] [place], [place] [address]  Ethanol     Status: None   Collection Time: 07/15/17  3:11 PM  Result Value Ref Range   Alcohol, Ethyl (B) <10 <10 mg/dL    Comment: (NOTE) Lowest detectable limit for serum alcohol is 10 mg/dL. For medical purposes only. Performed at [facility], [place] [address], [place], [place] [address]   cbc     Status: None   Collection Time: 07/15/17  3:11 PM  Result Value Ref Range   WBC 8.4 4.0 - 10.5 K/uL   RBC 5.28 4.22 - 5.81 MIL/uL   Hemoglobin 16.6 13.0 - 17.0 g/dL   HCT 46.3 39.0 - 52.0 %   MCV 87.7 78.0 - 100.0 fL   MCH 31.4 26.0 - 34.0 pg   MCHC 35.9 30.0 - 36.0 g/dL   RDW 12.3 11.5 - 15.5 %   Platelets 348 150 - 400 K/uL    Comment: Performed at [facility] [place] [address] [place], [place] [address]  Salicylate level     Status: None   Collection Time: 07/15/17  3:11 PM  Result Value Ref Range   Salicylate Lvl <1.5 2.8 - 30.0 mg/dL    Comment: Performed at [facility], [place] [address] [place], [place]  [address]  Acetaminophen level     Status: Abnormal   Collection Time: 07/15/17  3:11 PM  Result Value Ref Range   Acetaminophen (Tylenol), Serum <10 (L) 10 - 30 ug/mL    Comment: (NOTE) Therapeutic concentrations vary significantly. A range of 10-30 ug/mL  may be an effective concentration for many patients. However, some  are best treated at concentrations outside of this range. Acetaminophen concentrations >150 ug/mL at 4 hours after ingestion  and >50 ug/mL at 12 hours after ingestion are often associated with  toxic reactions. Performed at [facility], [place] [address], [place], [place] [address]   Hemoglobin A1c     Status: Abnormal   Collection Time: 07/16/17  6:09 AM  Result Value Ref Range   Hgb A1c MFr Bld 4.7 (L) 4.8 - 5.6 %    Comment: (NOTE) Pre diabetes:          5.7%-6.4% Diabetes:              >6.4% Glycemic control for   <7.0% adults with diabetes    Mean Plasma Glucose 88.19 mg/dL    Comment: Performed at [facility] [address] [place], [place] [address]  Lipid panel     Status: Abnormal   Collection Time: 07/16/17  6:09 AM  Result Value Ref Range   Cholesterol 168 0 - 200 mg/dL   Triglycerides 74 <150 mg/dL  HDL 50 >40 mg/dL   Total CHOL/HDL Ratio 3.4 RATIO   VLDL 15 0 - 40 mg/dL   LDL Cholesterol 103 (H) 0 - 99 mg/dL    Comment:        Total Cholesterol/HDL:CHD Risk Coronary Heart Disease Risk Table                     Men   Women  1/2 Average Risk   3.4   3.3  Average Risk       5.0   4.4  2 X Average Risk   9.6   7.1  3 X Average Risk  23.4   11.0        Use the calculated Patient Ratio above and the CHD Risk Table to determine the patient's CHD Risk.        ATP III CLASSIFICATION (LDL):  <100     mg/dL   Optimal  100-129  mg/dL   Near or Above                    Optimal  130-159  mg/dL   Borderline  160-189  mg/dL   High  >190     mg/dL   Very High Performed at [facility] [address], [place], [place] [address]   TSH     Status: None   Collection Time: 07/16/17  6:09 AM  Result Value Ref Range   TSH 1.926 0.350 - 4.500 uIU/mL    Comment: Performed by a 3rd Generation assay with a functional sensitivity of <=0.01 uIU/mL. Performed at [facility], [place] [address], [place], [place] [address]     Assessment/Plan: Generalized anxiety disorder Long-standing history. No current treatment. Is not taking the Hydroxyzine given by [facility] as he states it makes him feel very nauseated and groggy. Will add to list of intolerances. Discussed counseling as primary treatment. His wishes to proceed with this. Handout given so that he can schedule an appointment. Will start Alprazolam on as needed basis for severe acute anxiety only. Recent TSH unremarkable. Will follow-up 1 month. May need to start SSRI at that time but he declines for now. CSC signed today.  Of note patient with recent [facility] admission due to false report on him of suicidal ideation. Workup was negative and patient was discharged home.     Leeanne Rio, PA-C

## 2017-07-18 NOTE — Patient Instructions (Addendum)
Please use the handouts given to schedule an appointment with one of our counselors.  If you want to be seen tomorrow and our people are not able to accommodate, you can go to [facility] the [place] at [address], [place], [place] [address]. [phone]  Use the Alprazolam sparingly no more than as directed for severe acute anxiety only. Therapy will help with your anxiety. I also recommend that you download either the HeadSpace or Calm app on your phone. These will hopefully help with anxiety by taking you through exercises in mindfulness medication. These may seem silly at first but are very beneficial.   Follow-up with me in 1 month. We can do a complete physical at that time.

## 2017-07-21 ENCOUNTER — Ambulatory Visit: Payer: Self-pay | Admitting: Psychology

## 2017-07-26 ENCOUNTER — Ambulatory Visit: Payer: Self-pay | Admitting: Physician Assistant

## 2017-07-28 ENCOUNTER — Ambulatory Visit (INDEPENDENT_AMBULATORY_CARE_PROVIDER_SITE_OTHER): Payer: 59 | Admitting: Clinical

## 2017-07-28 DIAGNOSIS — F419 Anxiety disorder, unspecified: Secondary | ICD-10-CM | POA: Diagnosis not present

## 2017-08-02 ENCOUNTER — Other Ambulatory Visit: Payer: Self-pay | Admitting: Physician Assistant

## 2017-08-02 NOTE — Telephone Encounter (Signed)
Last OV 07/18/17, Next OV 08/17/17  Last filled 07/18/17, # 30 with 0 refills

## 2017-08-17 ENCOUNTER — Ambulatory Visit (INDEPENDENT_AMBULATORY_CARE_PROVIDER_SITE_OTHER): Payer: Managed Care, Other (non HMO) | Admitting: Physician Assistant

## 2017-08-17 ENCOUNTER — Other Ambulatory Visit: Payer: Self-pay

## 2017-08-17 ENCOUNTER — Encounter: Payer: Self-pay | Admitting: Physician Assistant

## 2017-08-17 VITALS — BP 116/82 | HR 76 | Temp 98.5°F | Resp 16 | Ht 69.0 in | Wt 161.4 lb

## 2017-08-17 DIAGNOSIS — Z0000 Encounter for general adult medical examination without abnormal findings: Secondary | ICD-10-CM | POA: Diagnosis not present

## 2017-08-17 DIAGNOSIS — F411 Generalized anxiety disorder: Secondary | ICD-10-CM | POA: Diagnosis not present

## 2017-08-17 MED ORDER — FLUOXETINE HCL 20 MG PO TABS: 20.0000 mg | ORAL_TABLET | Freq: Every day | ORAL | 3 refills | Status: DC

## 2017-08-17 NOTE — Assessment & Plan Note (Signed)
Continue Alprazolam but will switch to regular tablets. To use on a prn basis.  Will start Fluoxetine at 20 mg daily. Continue counseling sessions. Follow-up in 1 month for reassessment.

## 2017-08-17 NOTE — Patient Instructions (Signed)
Please go to the lab for blood work.  Our office will call you with your results unless you have chosen to receive results via MyChart. If your blood work is normal we will follow-up each year for physicals and as scheduled for chronic medical problems. If anything is abnormal we will treat accordingly and get you in for a follow-up.  Start the Fluoxetine daily as directed. Try to limit the xanax.   Preventive Care 18-39 Years, Male Preventive care refers to lifestyle choices and visits with your health care provider that can promote health and wellness. What does preventive care include?  A yearly physical exam. This is also called an annual well check.  Dental exams once or twice a year.  Routine eye exams. Ask your health care provider how often you should have your eyes checked.  Personal lifestyle choices, including: ? Daily care of your teeth and gums. ? Regular physical activity. ? Eating a healthy diet. ? Avoiding tobacco and drug use. ? Limiting alcohol use. ? Practicing safe sex. What happens during an annual well check? The services and screenings done by your health care provider during your annual well check will depend on your age, overall health, lifestyle risk factors, and family history of disease. Counseling Your health care provider may ask you questions about your:  Alcohol use.  Tobacco use.  Drug use.  Emotional well-being.  Home and relationship well-being.  Sexual activity.  Eating habits.  Work and work Statistician.  Screening You may have the following tests or measurements:  Height, weight, and BMI.  Blood pressure.  Lipid and cholesterol levels. These may be checked every 5 years starting at age 71.  Diabetes screening. This is done by checking your blood sugar (glucose) after you have not eaten for a while (fasting).  Skin check.  Hepatitis C blood test.  Hepatitis B blood test.  Sexually transmitted disease (STD)  testing.  Discuss your test results, treatment options, and if necessary, the need for more tests with your health care provider. Vaccines Your health care provider may recommend certain vaccines, such as:  Influenza vaccine. This is recommended every year.  Tetanus, diphtheria, and acellular pertussis (Tdap, Td) vaccine. You may need a Td booster every 10 years.  Varicella vaccine. You may need this if you have not been vaccinated.  HPV vaccine. If you are 9 or younger, you may need three doses over 6 months.  Measles, mumps, and rubella (MMR) vaccine. You may need at least one dose of MMR.You may also need a second dose.  Pneumococcal 13-valent conjugate (PCV13) vaccine. You may need this if you have certain conditions and have not been vaccinated.  Pneumococcal polysaccharide (PPSV23) vaccine. You may need one or two doses if you smoke cigarettes or if you have certain conditions.  Meningococcal vaccine. One dose is recommended if you are age 72-21 years and a first-year college student living in a residence hall, or if you have one of several medical conditions. You may also need additional booster doses.  Hepatitis A vaccine. You may need this if you have certain conditions or if you travel or work in places where you may be exposed to hepatitis A.  Hepatitis B vaccine. You may need this if you have certain conditions or if you travel or work in places where you may be exposed to hepatitis B.  Haemophilus influenzae type b (Hib) vaccine. You may need this if you have certain risk factors.  Talk to your health care  provider about which screenings and vaccines you need and how often you need them. This information is not intended to replace advice given to you by your health care provider. Make sure you discuss any questions you have with your health care provider. Document Released: 04/13/2001 Document Revised: 11/05/2015 Document Reviewed: 12/17/2014 Elsevier Interactive Patient  Education  [employer].

## 2017-08-17 NOTE — Assessment & Plan Note (Signed)
Depression screen negative. Health Maintenance reviewed. Preventive schedule discussed and handout given in AVS. Patient to return for fasting lab panels.

## 2017-08-17 NOTE — Progress Notes (Signed)
Patient presents to clinic today for annual exam.  Patient is fasting for labs.  Diet -- Eating mainly lean protein, some fruits and vegetables. 2500 max calorie/day. No fried  Exercise -- Goes to the gym 5/7 days per week.   Acute Concerns: Denies acute concerns today.  Chronic Issues: Generalized Anxiety Disorder -- Notes improvement with acute episodes of anxiety by using the Alprazolam. Was taking twice daily as directed. Is cutting back to once daily use and working on just using PRN. Still having generalized anxiety without depression. Is seeing the PsyD tomorrow for his first appointment. Is open to starting SSRI therapy.  Health Maintenance: Immunizations -- Unsure of last Tetanus.   Past Medical History:  Diagnosis Date  . ADHD (attention deficit hyperactivity disorder)    Treated with Adderall in middle school. None since that time. did not like the medicaiton  . Anxiety     Past Surgical History:  Procedure Laterality Date  . PERCUTANEOUS PINNING Right 06/13/2016   Procedure: CLOSED REDUCTION RIGHT HAND;  Surgeon: Dayna Barker, MD;  Location: [facility];  Service: Plastics;  Laterality: Right;    Current Outpatient Medications on File Prior to Visit  Medication Sig Dispense Refill  . ALPRAZolam (NIRAVAM) 0.5 MG dissolvable tablet DISSOLVE 1 TABLET(0.5 MG) ON THE TONGUE TWICE DAILY AS NEEDED FOR ANXIETY 30 tablet 0  . Multiple Vitamins-Minerals (MULTIVITAMIN ADULT PO) Take 1 tablet by mouth daily.     No current facility-administered medications on file prior to visit.     Allergies  Allergen Reactions  . Lactose Intolerance (Gi)     Stomach discomfort    Family History  Problem Relation Age of Onset  . Arthritis Father   . Heart attack Father   . Carpal tunnel syndrome Father   . Cancer Paternal Grandfather     Social History   Socioeconomic History  . Marital status: Single    Spouse name: Not on file  . Number of children: Not on file  . Years of  education: Not on file  . Highest education level: Not on file  Occupational History  . Not on file  Social Needs  . Financial resource strain: Not on file  . Food insecurity:    Worry: Not on file    Inability: Not on file  . Transportation needs:    Medical: Not on file    Non-medical: Not on file  Tobacco Use  . Smoking status: Never Smoker  . Smokeless tobacco: Current User  . Tobacco comment: Dip  Substance and Sexual Activity  . Alcohol use: Yes    Alcohol/week: 3.6 oz    Types: 6 Cans of beer per week  . Drug use: Yes    Types: Marijuana  . Sexual activity: Not Currently    Partners: Female  Lifestyle  . Physical activity:    Days per week: Not on file    Minutes per session: Not on file  . Stress: Not on file  Relationships  . Social connections:    Talks on phone: Not on file    Gets together: Not on file    Attends religious service: Not on file    Active member of club or organization: Not on file    Attends meetings of clubs or organizations: Not on file    Relationship status: Not on file  . Intimate partner violence:    Fear of current or ex partner: Not on file    Emotionally abused: Not on  file    Physically abused: Not on file    Forced sexual activity: Not on file  Other Topics Concern  . Not on file  Social History Narrative  . Not on file   Review of Systems  Constitutional: Negative for fever and weight loss.  HENT: Negative for ear discharge, ear pain, hearing loss and tinnitus.   Eyes: Negative for blurred vision, double vision, photophobia and pain.  Respiratory: Negative for cough and shortness of breath.   Cardiovascular: Negative for chest pain and palpitations.  Gastrointestinal: Negative for abdominal pain, blood in stool, constipation, diarrhea, heartburn, melena, nausea and vomiting.  Genitourinary: Negative for dysuria, flank pain, frequency, hematuria and urgency.  Musculoskeletal: Negative for falls.  Neurological: Negative for  dizziness, loss of consciousness and headaches.  Endo/Heme/Allergies: Negative for environmental allergies.  Psychiatric/Behavioral: Negative for depression, hallucinations, substance abuse and suicidal ideas. The patient is nervous/anxious. The patient does not have insomnia.     BP 116/82   Pulse 76   Temp 98.5 F (36.9 C) (Oral)   Resp 16   Ht _0  (1.753 m)   Wt 161 lb 6.4 oz (73.2 kg)   SpO2 98%   BMI 23.83 kg/m   Physical Exam  Constitutional: He is oriented to person, place, and time. He appears well-developed and well-nourished. No distress.  HENT:  Head: Normocephalic and atraumatic.  Right Ear: Tympanic membrane, external ear and ear canal normal.  Left Ear: Tympanic membrane, external ear and ear canal normal.  Nose: Nose normal.  Mouth/Throat: Oropharynx is clear and moist and mucous membranes are normal. No posterior oropharyngeal edema or posterior oropharyngeal erythema.  Eyes: Pupils are equal, round, and reactive to light. Conjunctivae are normal.  Neck: Neck supple. No thyromegaly present.  Cardiovascular: Normal rate, regular rhythm, normal heart sounds and intact distal pulses.  Pulmonary/Chest: Effort normal and breath sounds normal. No respiratory distress. He has no wheezes. He has no rales. He exhibits no tenderness.  Abdominal: Soft. Bowel sounds are normal. He exhibits no distension and no mass. There is no tenderness. There is no rebound and no guarding.  Lymphadenopathy:    He has no cervical adenopathy.  Neurological: He is alert and oriented to person, place, and time. No cranial nerve deficit.  Skin: Skin is warm and dry. No rash noted. He is not diaphoretic.  Psychiatric: He has a normal mood and affect.  Vitals reviewed.   Recent Results (from the past 2160 hour(s))  Rapid urine drug screen (hospital performed)     Status: Abnormal   Collection Time: 07/15/17  1:58 PM  Result Value Ref Range   Opiates NONE DETECTED NONE DETECTED   Cocaine  NONE DETECTED NONE DETECTED   Benzodiazepines POSITIVE (A) NONE DETECTED   Amphetamines NONE DETECTED NONE DETECTED   Tetrahydrocannabinol POSITIVE (A) NONE DETECTED   Barbiturates NONE DETECTED NONE DETECTED    Comment: (NOTE) DRUG SCREEN FOR MEDICAL PURPOSES ONLY.  IF CONFIRMATION IS NEEDED FOR ANY PURPOSE, NOTIFY LAB WITHIN 5 DAYS. LOWEST DETECTABLE LIMITS FOR URINE DRUG SCREEN Drug Class                     Cutoff (ng/mL) Amphetamine and metabolites    1000 Barbiturate and metabolites    200 Benzodiazepine                 237 Tricyclics and metabolites     300 Opiates and metabolites        300 Cocaine  and metabolites        300 THC                            50 Performed at [facility], [place] [address] [place], [place] [address]   Comprehensive metabolic panel     Status: Abnormal   Collection Time: 07/15/17  3:11 PM  Result Value Ref Range   Sodium 140 135 - 145 mmol/L   Potassium 4.2 3.5 - 5.1 mmol/L   Chloride 104 101 - 111 mmol/L   CO2 26 22 - 32 mmol/L   Glucose, Bld 87 65 - 99 mg/dL   BUN 9 6 - 20 mg/dL   Creatinine, Ser 0.91 0.61 - 1.24 mg/dL   Calcium 9.5 8.9 - 10.3 mg/dL   Total Protein 8.4 (H) 6.5 - 8.1 g/dL   Albumin 4.7 3.5 - 5.0 g/dL   AST 23 15 - 41 U/L   ALT 17 17 - 63 U/L   Alkaline Phosphatase 42 38 - 126 U/L   Total Bilirubin 0.8 0.3 - 1.2 mg/dL   GFR calc non Af Amer >60 >60 mL/min   GFR calc Af Amer >60 >60 mL/min    Comment: (NOTE) The eGFR has been calculated using the CKD EPI equation. This calculation has not been validated in all clinical situations. eGFR's persistently <60 mL/min signify possible Chronic Kidney Disease.    Anion gap 10 5 - 15    Comment: Performed at [facility], [place] [address], [place], [place] [address]  Ethanol     Status: None   Collection Time: 07/15/17  3:11 PM  Result Value Ref Range   Alcohol, Ethyl (B) <10 <10 mg/dL    Comment: (NOTE) Lowest detectable limit for  serum alcohol is 10 mg/dL. For medical purposes only. Performed at [facility], [place] [address], [place], [place] [address]   cbc     Status: None   Collection Time: 07/15/17  3:11 PM  Result Value Ref Range   WBC 8.4 4.0 - 10.5 K/uL   RBC 5.28 4.22 - 5.81 MIL/uL   Hemoglobin 16.6 13.0 - 17.0 g/dL   HCT 46.3 39.0 - 52.0 %   MCV 87.7 78.0 - 100.0 fL   MCH 31.4 26.0 - 34.0 pg   MCHC 35.9 30.0 - 36.0 g/dL   RDW 12.3 11.5 - 15.5 %   Platelets 348 150 - 400 K/uL    Comment: Performed at [facility], [place] [address], [place], [place] [address]  Salicylate level     Status: None   Collection Time: 07/15/17  3:11 PM  Result Value Ref Range   Salicylate Lvl <8.5 2.8 - 30.0 mg/dL    Comment: Performed at [facility], [place] [address] [place], [place] [address]  Acetaminophen level     Status: Abnormal   Collection Time: 07/15/17  3:11 PM  Result Value Ref Range   Acetaminophen (Tylenol), Serum <10 (L) 10 - 30 ug/mL    Comment: (NOTE) Therapeutic concentrations vary significantly. A range of 10-30 ug/mL  may be an effective concentration for many patients. However, some  are best treated at concentrations outside of this range. Acetaminophen concentrations >150 ug/mL at 4 hours after ingestion  and >50 ug/mL at 12 hours after ingestion are often associated with  toxic reactions. Performed at [facility], [place] [address], [place],  [address]  Hemoglobin A1c     Status: Abnormal   Collection Time: 07/16/17  6:09 AM  Result Value Ref Range   Hgb A1c MFr Bld 4.7 (L) 4.8 - 5.6 %    Comment: (NOTE) Pre diabetes:          5.7%-6.4% Diabetes:              >6.4% Glycemic control for   <7.0% adults with diabetes    Mean Plasma Glucose 88.19 mg/dL    Comment: Performed at [facility] [address] [place], [place] [address]  Lipid panel     Status: Abnormal   Collection Time: 07/16/17  6:09 AM    Result Value Ref Range   Cholesterol 168 0 - 200 mg/dL   Triglycerides 74 <150 mg/dL   HDL 50 >40 mg/dL   Total CHOL/HDL Ratio 3.4 RATIO   VLDL 15 0 - 40 mg/dL   LDL Cholesterol 103 (H) 0 - 99 mg/dL    Comment:        Total Cholesterol/HDL:CHD Risk Coronary Heart Disease Risk Table                     Men   Women  1/2 Average Risk   3.4   3.3  Average Risk       5.0   4.4  2 X Average Risk   9.6   7.1  3 X Average Risk  23.4   11.0        Use the calculated Patient Ratio above and the CHD Risk Table to determine the patient's CHD Risk.        ATP III CLASSIFICATION (LDL):  <100     mg/dL   Optimal  100-129  mg/dL   Near or Above                    Optimal  130-159  mg/dL   Borderline  160-189  mg/dL   High  >190     mg/dL   Very High Performed at [facility] [address] [place], [place] [address]   TSH     Status: None   Collection Time: 07/16/17  6:09 AM  Result Value Ref Range   TSH 1.926 0.350 - 4.500 uIU/mL    Comment: Performed by a 3rd Generation assay with a functional sensitivity of <=0.01 uIU/mL. Performed at [facility], [place] [address], [place], [place] [address]     Assessment/Plan: Visit for preventive health examination Depression screen negative. Health Maintenance reviewed. Preventive schedule discussed and handout given in AVS. Patient to return for fasting lab panels.   Generalized anxiety disorder Continue Alprazolam but will switch to regular tablets. To use on a prn basis.  Will start Fluoxetine at 20 mg daily. Continue counseling sessions. Follow-up in 1 month for reassessment.     Leeanne Rio, PA-C

## 2017-08-18 ENCOUNTER — Encounter: Payer: Self-pay | Admitting: Physician Assistant

## 2017-08-18 ENCOUNTER — Telehealth: Payer: Self-pay | Admitting: Emergency Medicine

## 2017-08-18 ENCOUNTER — Ambulatory Visit (INDEPENDENT_AMBULATORY_CARE_PROVIDER_SITE_OTHER): Payer: 59 | Admitting: Clinical

## 2017-08-18 ENCOUNTER — Other Ambulatory Visit: Payer: Self-pay | Admitting: Physician Assistant

## 2017-08-18 ENCOUNTER — Other Ambulatory Visit (INDEPENDENT_AMBULATORY_CARE_PROVIDER_SITE_OTHER): Payer: Managed Care, Other (non HMO)

## 2017-08-18 DIAGNOSIS — F419 Anxiety disorder, unspecified: Secondary | ICD-10-CM | POA: Diagnosis not present

## 2017-08-18 DIAGNOSIS — F411 Generalized anxiety disorder: Secondary | ICD-10-CM | POA: Diagnosis not present

## 2017-08-18 DIAGNOSIS — Z0000 Encounter for general adult medical examination without abnormal findings: Secondary | ICD-10-CM

## 2017-08-18 LAB — TSH: TSH: 1.66 u[IU]/mL (ref 0.35–4.50)

## 2017-08-18 LAB — CBC WITH DIFFERENTIAL/PLATELET
BASOS ABS: 0.2 10*3/uL — AB (ref 0.0–0.1)
BASOS PCT: 3 % (ref 0.0–3.0)
Eosinophils Absolute: 0.1 10*3/uL (ref 0.0–0.7)
Eosinophils Relative: 1.4 % (ref 0.0–5.0)
HEMATOCRIT: 46.9 % (ref 39.0–52.0)
HEMOGLOBIN: 16 g/dL (ref 13.0–17.0)
Lymphocytes Relative: 28.4 % (ref 12.0–46.0)
Lymphs Abs: 1.7 10*3/uL (ref 0.7–4.0)
MCHC: 34.1 g/dL (ref 30.0–36.0)
MCV: 92.4 fl (ref 78.0–100.0)
MONOS PCT: 9.4 % (ref 3.0–12.0)
Monocytes Absolute: 0.5 10*3/uL (ref 0.1–1.0)
NEUTROS ABS: 3.4 10*3/uL (ref 1.4–7.7)
Neutrophils Relative %: 57.8 % (ref 43.0–77.0)
PLATELETS: 274 10*3/uL (ref 150.0–400.0)
RBC: 5.08 Mil/uL (ref 4.22–5.81)
RDW: 14 % (ref 11.5–15.5)
WBC: 5.8 10*3/uL (ref 4.0–10.5)

## 2017-08-18 LAB — LIPID PANEL
CHOLESTEROL: 149 mg/dL (ref 0–200)
HDL: 43.1 mg/dL (ref >39.00)
LDL CALC: 95 mg/dL (ref 0–99)
NonHDL: 106.26
Total CHOL/HDL Ratio: 3
Triglycerides: 55 mg/dL (ref 0.0–149.0)
VLDL: 11 mg/dL (ref 0.0–40.0)

## 2017-08-18 LAB — COMPREHENSIVE METABOLIC PANEL
ALBUMIN: 4.5 g/dL (ref 3.5–5.2)
ALK PHOS: 29 U/L — AB (ref 39–117)
ALT: 12 U/L (ref 0–53)
AST: 15 U/L (ref 0–37)
BUN: 13 mg/dL (ref 6–23)
CALCIUM: 9.6 mg/dL (ref 8.4–10.5)
CHLORIDE: 102 meq/L (ref 96–112)
CO2: 28 mEq/L (ref 19–32)
Creatinine, Ser: 0.82 mg/dL (ref 0.40–1.50)
GFR: 124.74 mL/min (ref >60.00)
Glucose, Bld: 86 mg/dL (ref 70–99)
POTASSIUM: 4.5 meq/L (ref 3.5–5.1)
Sodium: 138 mEq/L (ref 135–145)
TOTAL PROTEIN: 7 g/dL (ref 6.0–8.3)
Total Bilirubin: 0.3 mg/dL (ref 0.2–1.2)

## 2017-08-18 LAB — TESTOSTERONE: TESTOSTERONE: 388.18 ng/dL (ref 300.00–890.00)

## 2017-08-18 MED ORDER — ALPRAZOLAM 0.5 MG PO TABS: 0.5000 mg | ORAL_TABLET | Freq: Two times a day (BID) | ORAL | 0 refills | Status: DC | PRN

## 2017-08-18 NOTE — Telephone Encounter (Signed)
Testosterone normal level. Will not start treatment either way but thank you for informing me so I can discuss with patient.

## 2017-08-18 NOTE — Telephone Encounter (Signed)
PCP sent in rx for patient.  Copied from CRM [id number]. Topic: General - Other >> Aug 18, 2017  9:22 AM Gerrianne ScalePayne, Angela L wrote: Reason for CRM: pt calling stating that the ALPRAZolam (NIRAVAM) 0.5 MG  tablet not the dissolvable ones was not sent to the   [id number] - [place], [place] - [phone] [address]  He states that it was going to be the tablets because it was cheaper

## 2017-08-30 ENCOUNTER — Ambulatory Visit: Payer: 59 | Admitting: Clinical

## 2017-09-02 ENCOUNTER — Other Ambulatory Visit: Payer: Self-pay | Admitting: Physician Assistant

## 2017-09-05 ENCOUNTER — Other Ambulatory Visit: Payer: Self-pay | Admitting: Physician Assistant

## 2017-09-05 NOTE — Telephone Encounter (Signed)
Last OV 08/17/17, No future OV  Last filled 08/18/17, # 30 with 0 refills

## 2017-09-06 ENCOUNTER — Other Ambulatory Visit: Payer: Self-pay | Admitting: Physician Assistant

## 2017-09-06 DIAGNOSIS — F411 Generalized anxiety disorder: Secondary | ICD-10-CM

## 2017-09-06 MED ORDER — PAROXETINE HCL 20 MG PO TABS: 20.0000 mg | ORAL_TABLET | Freq: Every day | ORAL | 1 refills | Status: DC

## 2017-09-06 NOTE — Telephone Encounter (Signed)
I will have him start Paroxetine 20 mg daily in place of the Fluoxetine. I have sent in Rx. Continue Alprazolam, trying to limit to one daily if possible.  Follow-up with me in 4 weeks. If having side effect of medication, he needs to call me to let me know.

## 2017-09-06 NOTE — Telephone Encounter (Signed)
Spoke with patient about how often he is taking the Xanax. Patient states he is taking bid every day.  He did start the Prozac but it made his stomach upset. He only took for 5 days. He states he is willing to try another medication. He is out of the Xanax. Please advise

## 2017-09-07 NOTE — Telephone Encounter (Signed)
Patient advised of medication changes. He is agreeable with starting the Paroxetine 20 mg daily. Rx sent to the pharmacy and advised to take Xanax daily as needed Appointment scheduled in month

## 2017-09-13 ENCOUNTER — Ambulatory Visit: Payer: 59 | Admitting: Clinical

## 2017-10-03 ENCOUNTER — Other Ambulatory Visit: Payer: Self-pay | Admitting: Physician Assistant

## 2017-10-03 DIAGNOSIS — F411 Generalized anxiety disorder: Secondary | ICD-10-CM

## 2017-10-03 NOTE — Telephone Encounter (Signed)
Last OV 08/17/17, Future OV 10/10/17  Last filled 09/06/17, # 30 with 0 refills

## 2017-10-10 ENCOUNTER — Encounter: Payer: Self-pay | Admitting: Physician Assistant

## 2017-10-10 ENCOUNTER — Other Ambulatory Visit: Payer: Self-pay

## 2017-10-10 ENCOUNTER — Ambulatory Visit (INDEPENDENT_AMBULATORY_CARE_PROVIDER_SITE_OTHER): Payer: Managed Care, Other (non HMO) | Admitting: Physician Assistant

## 2017-10-10 VITALS — BP 120/86 | HR 110 | Temp 98.6°F | Resp 17 | Ht 69.0 in | Wt 162.6 lb

## 2017-10-10 DIAGNOSIS — F411 Generalized anxiety disorder: Secondary | ICD-10-CM

## 2017-10-10 DIAGNOSIS — J029 Acute pharyngitis, unspecified: Secondary | ICD-10-CM | POA: Diagnosis not present

## 2017-10-10 LAB — POCT RAPID STREP A (OFFICE): Rapid Strep A Screen: NEGATIVE

## 2017-10-10 MED ORDER — AMOXICILLIN 875 MG PO TABS: 875.0000 mg | ORAL_TABLET | Freq: Two times a day (BID) | ORAL | 0 refills | Status: DC

## 2017-10-10 NOTE — Progress Notes (Signed)
Patient presents to clinic today for follow-up of anxiety. At last visit, patient was started on Paxil 20 mg daily. Endorses taking as directed with noted improvement. Denies noticeable side effect. Is taking his Alprazolam as directed when needed for more acute anxiety. Denies SI/HI.  Patient also notes 5 days of worsening sore throat with odynophagia and fatigue. Denies known fever, chills. Notes some blood when spitting. Denies URI symptoms.    Past Medical History:  Diagnosis Date  . ADHD (attention deficit hyperactivity disorder)    Treated with Adderall in middle school. None since that time. did not like the medicaiton  . Anxiety     Current Outpatient Medications on File Prior to Visit  Medication Sig Dispense Refill  . ALPRAZolam (XANAX) 0.5 MG tablet TAKE 1 TABLET BY MOUTH TWICE A DAY AS NEEDED FOR ANXIETY 30 tablet 0  . Multiple Vitamins-Minerals (MULTIVITAMIN ADULT PO) Take 1 tablet by mouth daily.    Marland Kitchen PARoxetine (PAXIL) 20 MG tablet Take 1 tablet (20 mg total) by mouth daily. 30 tablet 1   No current facility-administered medications on file prior to visit.     Allergies  Allergen Reactions  . Lactose Intolerance (Gi)     Stomach discomfort    Family History  Problem Relation Age of Onset  . Arthritis Father   . Heart attack Father   . Carpal tunnel syndrome Father   . Cancer Paternal Grandfather     Social History   Socioeconomic History  . Marital status: Single    Spouse name: Not on file  . Number of children: Not on file  . Years of education: Not on file  . Highest education level: Not on file  Occupational History  . Not on file  Social Needs  . Financial resource strain: Not on file  . Food insecurity:    Worry: Not on file    Inability: Not on file  . Transportation needs:    Medical: Not on file    Non-medical: Not on file  Tobacco Use  . Smoking status: Never Smoker  . Smokeless tobacco: Current User  . Tobacco comment: Dip    Substance and Sexual Activity  . Alcohol use: Yes    Alcohol/week: 6.0 standard drinks    Types: 6 Cans of beer per week  . Drug use: Yes    Types: Marijuana  . Sexual activity: Not Currently    Partners: Female  Lifestyle  . Physical activity:    Days per week: Not on file    Minutes per session: Not on file  . Stress: Not on file  Relationships  . Social connections:    Talks on phone: Not on file    Gets together: Not on file    Attends religious service: Not on file    Active member of club or organization: Not on file    Attends meetings of clubs or organizations: Not on file    Relationship status: Not on file  Other Topics Concern  . Not on file  Social History Narrative  . Not on file   Review of Systems - See HPI.  All other ROS are negative.  BP 120/86   Pulse (!) 110   Temp 98.6 F (37 C) (Oral)   Resp 17   Ht _0  (1.753 m)   Wt 162 lb 9.6 oz (73.8 kg)   SpO2 96%   BMI 24.01 kg/m   Physical Exam  Constitutional: He is oriented to person,  place, and time. He appears well-developed and well-nourished.  HENT:  Mouth/Throat: Uvula is midline and mucous membranes are normal. No uvula swelling. Oropharyngeal exudate (small amount of blood noted on R tonsil) present. No posterior oropharyngeal edema, posterior oropharyngeal erythema or tonsillar abscesses. Tonsils are 1+ on the right. Tonsils are 1+ on the left. Tonsillar exudate.  Neck: Neck supple.  Cardiovascular: Normal rate, regular rhythm and normal heart sounds.  Pulmonary/Chest: Effort normal and breath sounds normal.  Lymphadenopathy:    He has cervical adenopathy.  Neurological: He is alert and oriented to person, place, and time.  Psychiatric: He has a normal mood and affect.    Recent Results (from the past 2160 hour(s))  Rapid urine drug screen (hospital performed)     Status: Abnormal   Collection Time: 07/15/17  1:58 PM  Result Value Ref Range   Opiates NONE DETECTED NONE DETECTED    Cocaine NONE DETECTED NONE DETECTED   Benzodiazepines POSITIVE (A) NONE DETECTED   Amphetamines NONE DETECTED NONE DETECTED   Tetrahydrocannabinol POSITIVE (A) NONE DETECTED   Barbiturates NONE DETECTED NONE DETECTED    Comment: (NOTE) DRUG SCREEN FOR MEDICAL PURPOSES ONLY.  IF CONFIRMATION IS NEEDED FOR ANY PURPOSE, NOTIFY LAB WITHIN 5 DAYS. LOWEST DETECTABLE LIMITS FOR URINE DRUG SCREEN Drug Class                     Cutoff (ng/mL) Amphetamine and metabolites    1000 Barbiturate and metabolites    200 Benzodiazepine                 177 Tricyclics and metabolites     300 Opiates and metabolites        300 Cocaine and metabolites        300 THC                            50 Performed at [facility], [place] [address], [place], [place] [address]   Comprehensive metabolic panel     Status: Abnormal   Collection Time: 07/15/17  3:11 PM  Result Value Ref Range   Sodium 140 135 - 145 mmol/L   Potassium 4.2 3.5 - 5.1 mmol/L   Chloride 104 101 - 111 mmol/L   CO2 26 22 - 32 mmol/L   Glucose, Bld 87 65 - 99 mg/dL   BUN 9 6 - 20 mg/dL   Creatinine, Ser 0.91 0.61 - 1.24 mg/dL   Calcium 9.5 8.9 - 10.3 mg/dL   Total Protein 8.4 (H) 6.5 - 8.1 g/dL   Albumin 4.7 3.5 - 5.0 g/dL   AST 23 15 - 41 U/L   ALT 17 17 - 63 U/L   Alkaline Phosphatase 42 38 - 126 U/L   Total Bilirubin 0.8 0.3 - 1.2 mg/dL   GFR calc non Af Amer >60 >60 mL/min   GFR calc Af Amer >60 >60 mL/min    Comment: (NOTE) The eGFR has been calculated using the CKD EPI equation. This calculation has not been validated in all clinical situations. eGFR's persistently <60 mL/min signify possible Chronic Kidney Disease.    Anion gap 10 5 - 15    Comment: Performed at [facility], [place] [address], [place], [place] [address]  Ethanol     Status: None   Collection Time: 07/15/17  3:11 PM  Result Value Ref Range   Alcohol, Ethyl (B) <10 <10 mg/dL  Comment: (NOTE) Lowest detectable limit  for serum alcohol is 10 mg/dL. For medical purposes only. Performed at [facility], [place] [address] [place], [place] [address]   cbc     Status: None   Collection Time: 07/15/17  3:11 PM  Result Value Ref Range   WBC 8.4 4.0 - 10.5 K/uL   RBC 5.28 4.22 - 5.81 MIL/uL   Hemoglobin 16.6 13.0 - 17.0 g/dL   HCT 46.3 39.0 - 52.0 %   MCV 87.7 78.0 - 100.0 fL   MCH 31.4 26.0 - 34.0 pg   MCHC 35.9 30.0 - 36.0 g/dL   RDW 12.3 11.5 - 15.5 %   Platelets 348 150 - 400 K/uL    Comment: Performed at [facility], [place] [address], [place], [place] [address]  Salicylate level     Status: None   Collection Time: 07/15/17  3:11 PM  Result Value Ref Range   Salicylate Lvl <6.2 2.8 - 30.0 mg/dL    Comment: Performed at [facility], [place] [address] [place], [place] [address]  Acetaminophen level     Status: Abnormal   Collection Time: 07/15/17  3:11 PM  Result Value Ref Range   Acetaminophen (Tylenol), Serum <10 (L) 10 - 30 ug/mL    Comment: (NOTE) Therapeutic concentrations vary significantly. A range of 10-30 ug/mL  may be an effective concentration for many patients. However, some  are best treated at concentrations outside of this range. Acetaminophen concentrations >150 ug/mL at 4 hours after ingestion  and >50 ug/mL at 12 hours after ingestion are often associated with  toxic reactions. Performed at [facility], [place] [address] [place], [place] [address]   Hemoglobin A1c     Status: Abnormal   Collection Time: 07/16/17  6:09 AM  Result Value Ref Range   Hgb A1c MFr Bld 4.7 (L) 4.8 - 5.6 %    Comment: (NOTE) Pre diabetes:          5.7%-6.4% Diabetes:              >6.4% Glycemic control for   <7.0% adults with diabetes    Mean Plasma Glucose 88.19 mg/dL    Comment: Performed at [place] [address], [place], [place] [address]  Lipid panel     Status: Abnormal   Collection Time: 07/16/17  6:09  AM  Result Value Ref Range   Cholesterol 168 0 - 200 mg/dL   Triglycerides 74 <150 mg/dL   HDL 50 >40 mg/dL   Total CHOL/HDL Ratio 3.4 RATIO   VLDL 15 0 - 40 mg/dL   LDL Cholesterol 103 (H) 0 - 99 mg/dL    Comment:        Total Cholesterol/HDL:CHD Risk Coronary Heart Disease Risk Table                     Men   Women  1/2 Average Risk   3.4   3.3  Average Risk       5.0   4.4  2 X Average Risk   9.6   7.1  3 X Average Risk  23.4   11.0        Use the calculated Patient Ratio above and the CHD Risk Table to determine the patient's CHD Risk.        ATP III CLASSIFICATION (LDL):  <100     mg/dL   Optimal  100-129  mg/dL  Near or Above                    Optimal  130-159  mg/dL   Borderline  160-189  mg/dL   High  >190     mg/dL   Very High Performed at [place] [address] [place], [place] [address]   TSH     Status: None   Collection Time: 07/16/17  6:09 AM  Result Value Ref Range   TSH 1.926 0.350 - 4.500 uIU/mL    Comment: Performed by a 3rd Generation assay with a functional sensitivity of <=0.01 uIU/mL. Performed at [facility], [place] [address] [place], [place] [address]   CBC w/Diff     Status: Abnormal   Collection Time: 08/18/17  9:00 AM  Result Value Ref Range   WBC 5.8 4.0 - 10.5 K/uL   RBC 5.08 4.22 - 5.81 Mil/uL   Hemoglobin 16.0 13.0 - 17.0 g/dL   HCT 46.9 39.0 - 52.0 %   MCV 92.4 78.0 - 100.0 fl   MCHC 34.1 30.0 - 36.0 g/dL   RDW 14.0 11.5 - 15.5 %   Platelets 274.0 150.0 - 400.0 K/uL   Neutrophils Relative % 57.8 43.0 - 77.0 %   Lymphocytes Relative 28.4 12.0 - 46.0 %   Monocytes Relative 9.4 3.0 - 12.0 %   Eosinophils Relative 1.4 0.0 - 5.0 %   Basophils Relative 3.0 0.0 - 3.0 %   Neutro Abs 3.4 1.4 - 7.7 K/uL   Lymphs Abs 1.7 0.7 - 4.0 K/uL   Monocytes Absolute 0.5 0.1 - 1.0 K/uL   Eosinophils Absolute 0.1 0.0 - 0.7 K/uL   Basophils Absolute 0.2 (H) 0.0 - 0.1 K/uL  TSH     Status: None    Collection Time: 08/18/17  9:00 AM  Result Value Ref Range   TSH 1.66 0.35 - 4.50 uIU/mL  Testosterone     Status: None   Collection Time: 08/18/17  9:00 AM  Result Value Ref Range   Testosterone 388.18 300.00 - 890.00 ng/dL  Lipid panel     Status: None   Collection Time: 08/18/17  9:00 AM  Result Value Ref Range   Cholesterol 149 0 - 200 mg/dL    Comment: ATP III Classification       Desirable:  < 200 mg/dL               Borderline High:  200 - 239 mg/dL          High:  > = 240 mg/dL   Triglycerides 55.0 0.0 - 149.0 mg/dL    Comment: Normal:  <150 mg/dLBorderline High:  150 - 199 mg/dL   HDL 43.10 >39.00 mg/dL   VLDL 11.0 0.0 - 40.0 mg/dL   LDL Cholesterol 95 0 - 99 mg/dL   Total CHOL/HDL Ratio 3     Comment:                Men          Women1/2 Average Risk     3.4          3.3Average Risk          5.0          4.42X Average Risk          9.6          7.13X Average Risk          15.0  11.0                       NonHDL 106.26     Comment: NOTE:  Non-HDL goal should be 30 mg/dL higher than patient's LDL goal (i.e. LDL goal of < 70 mg/dL, would have non-HDL goal of < 100 mg/dL)  Comp Met (CMET)     Status: Abnormal   Collection Time: 08/18/17  9:00 AM  Result Value Ref Range   Sodium 138 135 - 145 mEq/L   Potassium 4.5 3.5 - 5.1 mEq/L   Chloride 102 96 - 112 mEq/L   CO2 28 19 - 32 mEq/L   Glucose, Bld 86 70 - 99 mg/dL   BUN 13 6 - 23 mg/dL   Creatinine, Ser 0.82 0.40 - 1.50 mg/dL   Total Bilirubin 0.3 0.2 - 1.2 mg/dL   Alkaline Phosphatase 29 (L) 39 - 117 U/L   AST 15 0 - 37 U/L   ALT 12 0 - 53 U/L   Total Protein 7.0 6.0 - 8.3 g/dL   Albumin 4.5 3.5 - 5.2 g/dL   Calcium 9.6 8.4 - 10.5 mg/dL   GFR 124.74 >60.00 mL/min  POCT rapid strep A     Status: Normal   Collection Time: 10/10/17  8:29 AM  Result Value Ref Range   Rapid Strep A Screen Negative Negative    Assessment/Plan: Generalized anxiety disorder Doing well presently. Will have him continue current  regimen. Will monitor closely. Follow-up in 6 months.  Sore throat Rapid strep negative. Patient with tonsillar swelling, adenopathy and exudate. Will treat as bacterial tonsillitis with Amoxicillin. Giving bleeding noted will set him up with ENT. He is to stop use of NSAIDs. Start tylenol for pain along with salt-water gargles and chloraseptic spray. Warm liquids recommended.     Leeanne Rio, PA-C

## 2017-10-10 NOTE — Assessment & Plan Note (Signed)
Rapid strep negative. Patient with tonsillar swelling, adenopathy and exudate. Will treat as bacterial tonsillitis with Amoxicillin. Giving bleeding noted will set him up with ENT. He is to stop use of NSAIDs. Start tylenol for pain along with salt-water gargles and chloraseptic spray. Warm liquids recommended.

## 2017-10-10 NOTE — Assessment & Plan Note (Signed)
Doing well presently. Will have him continue current regimen. Will monitor closely. Follow-up in 6 months.

## 2017-10-10 NOTE — Patient Instructions (Signed)
Please start the antibiotic as directed. Tylenol for pain. Avoid ibuprofen.  Salt water gargles to help soothe the throat and tonsils.  I am setting you up with ENT for further assessment.   Continue your chronic medications as directed. We will follow-up in 6 months. Return sooner if needed.

## 2017-10-11 ENCOUNTER — Encounter: Payer: Self-pay | Admitting: Physician Assistant

## 2017-10-20 ENCOUNTER — Encounter: Payer: Self-pay | Admitting: Physician Assistant

## 2017-10-20 DIAGNOSIS — B370 Candidal stomatitis: Secondary | ICD-10-CM

## 2017-10-21 ENCOUNTER — Encounter: Payer: Self-pay | Admitting: Physician Assistant

## 2017-10-21 MED ORDER — NYSTATIN 100000 UNIT/ML MT SUSP: 5.0000 mL | Freq: Four times a day (QID) | OROMUCOSAL | 0 refills | Status: DC

## 2017-10-27 ENCOUNTER — Other Ambulatory Visit: Payer: Self-pay | Admitting: Physician Assistant

## 2017-10-27 DIAGNOSIS — F411 Generalized anxiety disorder: Secondary | ICD-10-CM

## 2017-10-27 NOTE — Telephone Encounter (Signed)
Last OV 10/10/17, Next OV 04/12/2018  Last filled 10/03/17, # 30 with 0 refills

## 2017-10-31 ENCOUNTER — Other Ambulatory Visit: Payer: Self-pay | Admitting: Physician Assistant

## 2017-10-31 DIAGNOSIS — F411 Generalized anxiety disorder: Secondary | ICD-10-CM

## 2017-11-15 ENCOUNTER — Other Ambulatory Visit: Payer: Managed Care, Other (non HMO)

## 2017-11-15 ENCOUNTER — Other Ambulatory Visit: Payer: Self-pay | Admitting: Physician Assistant

## 2017-11-15 DIAGNOSIS — F411 Generalized anxiety disorder: Secondary | ICD-10-CM

## 2017-11-15 NOTE — Telephone Encounter (Signed)
Patient advised of rx is ready at the front desk for pick and to give a UDS specimen. Will pick up tomorrow.

## 2017-11-15 NOTE — Telephone Encounter (Signed)
Last refill 10/27/17 #30 Next appt: 04/12/18 No CSC No UDS  Please advise

## 2017-11-16 ENCOUNTER — Other Ambulatory Visit: Payer: Self-pay

## 2017-11-18 LAB — PAIN MGMT, PROFILE 8 W/CONF, U
6 ACETYLMORPHINE: NEGATIVE ng/mL (ref <10)
AMINOCLONAZEPAM: 109 ng/mL — AB (ref <25)
AMPHETAMINES: NEGATIVE ng/mL (ref <500)
Alcohol Metabolites: NEGATIVE ng/mL (ref <500)
Alphahydroxyalprazolam: 658 ng/mL — ABNORMAL HIGH (ref <25)
Alphahydroxymidazolam: NEGATIVE ng/mL (ref <50)
Alphahydroxytriazolam: NEGATIVE ng/mL (ref <50)
Benzodiazepines: POSITIVE ng/mL — AB (ref <100)
Buprenorphine, Urine: NEGATIVE ng/mL (ref <5)
CREATININE: 202.7 mg/dL
Cocaine Metabolite: NEGATIVE ng/mL (ref <150)
Hydroxyethylflurazepam: NEGATIVE ng/mL (ref <50)
Lorazepam: NEGATIVE ng/mL (ref <50)
MDMA: NEGATIVE ng/mL (ref <500)
Marijuana Metabolite: 34 ng/mL — ABNORMAL HIGH (ref <5)
Marijuana Metabolite: POSITIVE ng/mL — AB (ref <20)
Nordiazepam: NEGATIVE ng/mL (ref <50)
OPIATES: NEGATIVE ng/mL (ref <100)
OXIDANT: NEGATIVE ug/mL (ref <200)
OXYCODONE: NEGATIVE ng/mL (ref <100)
Oxazepam: NEGATIVE ng/mL (ref <50)
TEMAZEPAM: NEGATIVE ng/mL (ref <50)
pH: 6.83 (ref 4.5–9.0)

## 2017-11-22 ENCOUNTER — Encounter: Payer: Self-pay | Admitting: Physician Assistant

## 2017-12-07 ENCOUNTER — Encounter: Payer: Self-pay | Admitting: Physician Assistant

## 2017-12-07 ENCOUNTER — Other Ambulatory Visit: Payer: Self-pay | Admitting: Physician Assistant

## 2017-12-07 DIAGNOSIS — F411 Generalized anxiety disorder: Secondary | ICD-10-CM

## 2017-12-07 NOTE — Telephone Encounter (Signed)
Last Xanax filled 11/15/17 #30 Last OV was 10/10/17 He is to follow up in 6 month

## 2017-12-08 ENCOUNTER — Ambulatory Visit (INDEPENDENT_AMBULATORY_CARE_PROVIDER_SITE_OTHER): Payer: Managed Care, Other (non HMO) | Admitting: Physician Assistant

## 2017-12-08 ENCOUNTER — Other Ambulatory Visit: Payer: Self-pay

## 2017-12-08 ENCOUNTER — Encounter: Payer: Self-pay | Admitting: Emergency Medicine

## 2017-12-08 ENCOUNTER — Encounter: Payer: Self-pay | Admitting: Physician Assistant

## 2017-12-08 VITALS — BP 126/88 | HR 96 | Temp 98.2°F | Resp 14 | Ht 69.0 in | Wt 165.0 lb

## 2017-12-08 DIAGNOSIS — F411 Generalized anxiety disorder: Secondary | ICD-10-CM | POA: Diagnosis not present

## 2017-12-08 MED ORDER — ALPRAZOLAM 1 MG PO TABS: 1.0000 mg | ORAL_TABLET | Freq: Three times a day (TID) | ORAL | 0 refills | Status: DC | PRN

## 2017-12-08 NOTE — Patient Instructions (Signed)
Please continue the Paroxetine as directed. This will take some time to get in system fully but I am glad you are noticing some improvement. Try taking it in the evening instead.   We will restart Alprazolam to take up to three times daily if needed. Limit doses when not needed!  I will be talking to Dr. Dewayne Hatch to see about moving up your appointment.   Follow-up 4 weeks.

## 2017-12-08 NOTE — Progress Notes (Signed)
Patient presents to clinic today for follow-up regarding anxiety. Patient has noted worsening of anxiety over the past month. Is prescribed Paxil 20 mg daily but has not been taking. Did decide to start it last week. Is tolerating well so far, just noting some sleepiness after taking. Feels it is helping somewhat so far. Is still having panic attacks for which he uses Alprazolam. Has appointment scheduled with counseling (Dr. Dewayne Hatch) but cannot see her until mid November.   Past Medical History:  Diagnosis Date  . ADHD (attention deficit hyperactivity disorder)    Treated with Adderall in middle school. None since that time. did not like the medicaiton  . Anxiety     Current Outpatient Medications on File Prior to Visit  Medication Sig Dispense Refill  . Multiple Vitamins-Minerals (MULTIVITAMIN ADULT PO) Take 1 tablet by mouth daily.    Marland Kitchen PARoxetine (PAXIL) 20 MG tablet TAKE 1 TABLET BY MOUTH EVERY DAY 30 tablet 3   No current facility-administered medications on file prior to visit.    Allergies  Allergen Reactions  . Lactose Intolerance (Gi)     Stomach discomfort    Family History  Problem Relation Age of Onset  . Arthritis Father   . Heart attack Father   . Carpal tunnel syndrome Father   . Cancer Paternal Grandfather     Social History   Socioeconomic History  . Marital status: Single    Spouse name: Not on file  . Number of children: Not on file  . Years of education: Not on file  . Highest education level: Not on file  Occupational History  . Not on file  Social Needs  . Financial resource strain: Not on file  . Food insecurity:    Worry: Not on file    Inability: Not on file  . Transportation needs:    Medical: Not on file    Non-medical: Not on file  Tobacco Use  . Smoking status: Never Smoker  . Smokeless tobacco: Current User  . Tobacco comment: Dip  Substance and Sexual Activity  . Alcohol use: Yes    Alcohol/week: 6.0 standard drinks   Types: 6 Cans of beer per week  . Drug use: Yes    Types: Marijuana  . Sexual activity: Not Currently    Partners: Female  Lifestyle  . Physical activity:    Days per week: Not on file    Minutes per session: Not on file  . Stress: Not on file  Relationships  . Social connections:    Talks on phone: Not on file    Gets together: Not on file    Attends religious service: Not on file    Active member of club or organization: Not on file    Attends meetings of clubs or organizations: Not on file    Relationship status: Not on file  Other Topics Concern  . Not on file  Social History Narrative  . Not on file   Review of Systems - See HPI.  All other ROS are negative.  BP 126/88   Pulse 96   Temp 98.2 F (36.8 C) (Oral)   Resp 14   Ht 5\' 9"  (1.753 m)   Wt 165 lb (74.8 kg)   SpO2 98%   BMI 24.37 kg/m   Physical Exam  Constitutional: He is oriented to person, place, and time. He appears well-developed and well-nourished.  HENT:  Head: Normocephalic and atraumatic.  Cardiovascular: Normal rate, regular rhythm, normal heart sounds  and intact distal pulses.  Pulmonary/Chest: Effort normal and breath sounds normal. No stridor. No respiratory distress. He has no wheezes. He has no rales. He exhibits no tenderness.  Neurological: He is alert and oriented to person, place, and time.  Psychiatric: His speech is normal and behavior is normal. Judgment and thought content normal. His mood appears anxious. Cognition and memory are normal.  Vitals reviewed.   Recent Results (from the past 2160 hour(s))  POCT rapid strep A     Status: Normal   Collection Time: 10/10/17  8:29 AM  Result Value Ref Range   Rapid Strep A Screen Negative Negative  Pain Mgmt, Profile 8 w/Conf, U     Status: Abnormal   Collection Time: 11/15/17  1:19 PM  Result Value Ref Range   Creatinine 202.7 > or = 20. mg/dL   pH 1.61 4.5 - 9.0   Oxidant NEGATIVE <200 mcg/mL   Amphetamines NEGATIVE <500 ng/mL    medMATCH Amphetamines CONSISTENT    Benzodiazepines POSITIVE (A) <100 ng/mL   Alphahydroxyalprazolam 658 (H) <25 ng/mL    Comment: See Note 1   medMATCH aOH alprazolam INCONSISTENT    Alphahydroxymidazolam NEGATIVE <50 ng/mL    Comment: See Note 1   medMATCH aOH midazolam CONSISTENT    Alphahydroxytriazolam NEGATIVE <50 ng/mL    Comment: See Note 1   medMATCH aOH triazolam CONSISTENT    Aminoclonazepam 109 (H) <25 ng/mL    Comment: See Note 1   medMATCH Aminoclonazepam INCONSISTENT    Hydroxyethylflurazepam NEGATIVE <50 ng/mL    Comment: See Note 1   medMATCH OH,Et flurazepam CONSISTENT    Lorazepam NEGATIVE <50 ng/mL    Comment: See Note 1   medMATCH Lorazepam CONSISTENT    Nordiazepam NEGATIVE <50 ng/mL    Comment: See Note 1   medMATCH Nordiazepam CONSISTENT    Oxazepam NEGATIVE <50 ng/mL    Comment: See Note 1   medMATCH Oxazepam CONSISTENT    Temazepam NEGATIVE <50 ng/mL    Comment: See Note 1   medMATCH Temazepam CONSISTENT    Marijuana Metabolite POSITIVE (A) <20 ng/mL   Marijuana Metabolite 34 (H) <5 ng/mL    Comment: See Note 1   medMATCH Marijuana Metab INCONSISTENT    Cocaine Metabolite NEGATIVE <150 ng/mL   medMATCH Cocaine Metab CONSISTENT    Opiates NEGATIVE <100 ng/mL   medMATCH Opiates CONSISTENT    Oxycodone NEGATIVE <100 ng/mL   medMATCH Oxycodone CONSISTENT     Comment: Note 1 . This test was developed and its analytical performance  characteristics have been determined by [employer]. It has not been cleared or approved by the FDA. This assay has been validated pursuant to the CLIA  regulations and is used for clinical purposes.    Buprenorphine, Urine NEGATIVE <5 ng/mL   medMATCH Buprenorphine CONSISTENT    MDMA NEGATIVE <500 ng/mL   [facility] MDMA CONSISTENT    Alcohol Metabolites NEGATIVE <500 ng/mL   medMATCH Alcohol Metab CONSISTENT    6 Acetylmorphine NEGATIVE <10 ng/mL   medMATCH 6 Acetylmorphine CONSISTENT     Comment: This drug  testing is for medical treatment only.   Analysis was performed as non-forensic testing and  these results should be used only by healthcare  providers to render diagnosis or treatment, or to  monitor progress of medical conditions. Sharyn Lull comments are:  - present when drug test results may be the result of     metabolism of one or more drugs or  when results are     inconsistent with prescribed medication(s) listed.  - may be blank when drug results are consistent with     prescribed medication(s) listed. . For assistance with interpreting these drug results,  please contact a [employer] Toxicology  Specialist: [phone] TOX [phone]), M-F,  8am-6pm EST. This drug testing is for medical treatment only.   Analysis was performed as non-forensic testing and  these results should be used only by healthcare  providers to render diagnosis or treatment, or to  monitor progress of medical conditions. Sharyn Lull comments are:  - present when  drug test results may be the result of     metabolism of one or more drugs or when results are     inconsistent with prescribed medication(s) listed.  - may be blank when drug results are consistent with     prescribed medication(s) listed. . For assistance with interpreting these drug results,  please contact a [employer] Toxicology  Specialist: [phone] TOX [phone]), M-F,  8am-6pm EST. This drug testing is for medical treatment only.   Analysis was performed as non-forensic testing and  these results should be used only by healthcare  providers to render diagnosis or treatment, or to  monitor progress of medical conditions. Sharyn Lull comments are:  - present when drug test results may be the result of     metabolism of one or more drugs or when results are     inconsistent with prescribed medication(s) listed.  - may be blank when drug results are consistent with     prescribed medication(s)  listed. . For assistance with interpreting these d rug results,  please contact a [employer], [employer] Toxicology  Specialist: [phone] TOX [phone]), M-F,  8am-6pm EST. This drug testing is for medical treatment only.   Analysis was performed as non-forensic testing and  these results should be used only by healthcare  providers to render diagnosis or treatment, or to  monitor progress of medical conditions. Sharyn Lull comments are:  - present when drug test results may be the result of     metabolism of one or more drugs or when results are     inconsistent with prescribed medication(s) listed.  - may be blank when drug results are consistent with     prescribed medication(s) listed. . For assistance with interpreting these drug results,  please contact a [employer] Toxicology  Specialist: [phone] TOX [phone]), M-F,  8am-6pm EST. This drug testing is for medical treatment only.   Analysis was performed as non-forensic testing and  these results should be used only by healthcare  provi ders to render diagnosis or treatment, or to  monitor progress of medical conditions. Sharyn Lull comments are:  - present when drug test results may be the result of     metabolism of one or more drugs or when results are     inconsistent with prescribed medication(s) listed.  - may be blank when drug results are consistent with     prescribed medication(s) listed. . For assistance with interpreting these drug results,  please contact a [employer] Toxicology  Specialist: [phone] TOX [phone]), M-F,  8am-6pm EST.    Assessment/Plan: Generalized anxiety disorder Is now taking Paxil regularly and starting to note some improvement. Discussed it will take a few weeks to see true impact of this dose of medication. Will have him continue. CSC reviewed and a new one has been completed and signed. He is aware he cannot  test + for THC. Medications  refilled. Follow-up 4-6 weeks.     Piedad Climes, PA-C

## 2017-12-08 NOTE — Assessment & Plan Note (Signed)
Is now taking Paxil regularly and starting to note some improvement. Discussed it will take a few weeks to see true impact of this dose of medication. Will have him continue. CSC reviewed and a new one has been completed and signed. He is aware he cannot test + for THC. Medications refilled. Follow-up 4-6 weeks.

## 2018-01-09 ENCOUNTER — Ambulatory Visit: Payer: Managed Care, Other (non HMO) | Admitting: Physician Assistant

## 2018-01-09 DIAGNOSIS — Z0289 Encounter for other administrative examinations: Secondary | ICD-10-CM

## 2018-01-11 ENCOUNTER — Encounter: Payer: Self-pay | Admitting: Physician Assistant

## 2018-01-17 ENCOUNTER — Encounter: Payer: Self-pay | Admitting: Physician Assistant

## 2018-01-17 ENCOUNTER — Ambulatory Visit (INDEPENDENT_AMBULATORY_CARE_PROVIDER_SITE_OTHER): Payer: Managed Care, Other (non HMO) | Admitting: Physician Assistant

## 2018-01-17 ENCOUNTER — Other Ambulatory Visit: Payer: Self-pay

## 2018-01-17 VITALS — BP 130/98 | HR 92 | Temp 97.7°F | Resp 16 | Ht 69.0 in | Wt 175.0 lb

## 2018-01-17 DIAGNOSIS — F411 Generalized anxiety disorder: Secondary | ICD-10-CM | POA: Diagnosis not present

## 2018-01-17 MED ORDER — ALPRAZOLAM 1 MG PO TABS: 1.0000 mg | ORAL_TABLET | Freq: Three times a day (TID) | ORAL | 0 refills | Status: DC | PRN

## 2018-01-17 MED ORDER — PAROXETINE HCL 40 MG PO TABS: 40.0000 mg | ORAL_TABLET | ORAL | 3 refills | Status: DC

## 2018-01-17 NOTE — Assessment & Plan Note (Signed)
Will increase Paxil to 40 mg daily. Continue Xanax. Follow-up 3 months. Return sooner if needed.

## 2018-01-17 NOTE — Progress Notes (Signed)
Acute Office Visit  Subjective:    Patient ID: Hunter Dawson, male    DOB: Feb 28, 1996, 22 y.o.   MRN: [id number]  Chief Complaint  Patient presents with  . Anxiety   HPI Patient is in today for follow-up of anxiety and panic attack. At last visit, patient was put on the Paxil 20 mg daily. Endorses taking daily as directed. Denies any side effect with medication. Notes improvement in mood. Is sleeping well. Notes good appetite overall.  Past Medical History:  Diagnosis Date  . ADHD (attention deficit hyperactivity disorder)    Treated with Adderall in middle school. None since that time. did not like the medicaiton  . Anxiety     Past Surgical History:  Procedure Laterality Date  . PERCUTANEOUS PINNING Right 06/13/2016   Procedure: CLOSED REDUCTION RIGHT HAND;  Surgeon: Knute Neu, MD;  Location: [facility] OR;  Service: Plastics;  Laterality: Right;    Family History  Problem Relation Age of Onset  . Arthritis Father   . Heart attack Father   . Carpal tunnel syndrome Father   . Cancer Paternal Grandfather     Social History   Socioeconomic History  . Marital status: Single    Spouse name: Not on file  . Number of children: Not on file  . Years of education: Not on file  . Highest education level: Not on file  Occupational History  . Not on file  Social Needs  . Financial resource strain: Not on file  . Food insecurity:    Worry: Not on file    Inability: Not on file  . Transportation needs:    Medical: Not on file    Non-medical: Not on file  Tobacco Use  . Smoking status: Never Smoker  . Smokeless tobacco: Current User  . Tobacco comment: Dip  Substance and Sexual Activity  . Alcohol use: Yes    Alcohol/week: 6.0 standard drinks    Types: 6 Cans of beer per week  . Drug use: Yes    Types: Marijuana  . Sexual activity: Not Currently    Partners: Female  Lifestyle  . Physical activity:    Days per week: Not on file    Minutes per session: Not on file    . Stress: Not on file  Relationships  . Social connections:    Talks on phone: Not on file    Gets together: Not on file    Attends religious service: Not on file    Active member of club or organization: Not on file    Attends meetings of clubs or organizations: Not on file    Relationship status: Not on file  . Intimate partner violence:    Fear of current or ex partner: Not on file    Emotionally abused: Not on file    Physically abused: Not on file    Forced sexual activity: Not on file  Other Topics Concern  . Not on file  Social History Narrative  . Not on file    Outpatient Medications Prior to Visit  Medication Sig Dispense Refill  . ALPRAZolam (XANAX) 1 MG tablet Take 1 tablet (1 mg total) by mouth 3 (three) times daily as needed for anxiety. 60 tablet 0  . Multiple Vitamins-Minerals (MULTIVITAMIN ADULT PO) Take 1 tablet by mouth daily.    Marland Kitchen PARoxetine (PAXIL) 20 MG tablet TAKE 1 TABLET BY MOUTH EVERY DAY 30 tablet 3   No facility-administered medications prior to visit.  Allergies  Allergen Reactions  . Lactose Intolerance (Gi)     Stomach discomfort   ROS Pertinent ROS are listed in the HPI.    Objective:    Physical Exam  Constitutional: He is oriented to person, place, and time. He appears well-developed and well-nourished.  HENT:  Head: Normocephalic and atraumatic.  Cardiovascular: Normal rate, regular rhythm, normal heart sounds and intact distal pulses.  Pulmonary/Chest: Effort normal.  Neurological: He is alert and oriented to person, place, and time.  Psychiatric: He has a normal mood and affect.  Vitals reviewed.   BP (!) 130/98   Pulse 92   Temp 97.7 F (36.5 C) (Oral)   Resp 16   Ht 5\' 9"  (1.753 m)   Wt 175 lb (79.4 kg)   SpO2 98%   BMI 25.84 kg/m  Wt Readings from Last 3 Encounters:  01/17/18 175 lb (79.4 kg)  12/08/17 165 lb (74.8 kg)  10/10/17 162 lb 9.6 oz (73.8 kg)    Health Maintenance Due  Topic Date Due  .  DTaP/Tdap/Td (1 - Tdap) 07/08/2014  . TETANUS/TDAP  07/08/2014    There are no preventive care reminders to display for this patient.   Lab Results  Component Value Date   TSH 1.66 08/18/2017   Lab Results  Component Value Date   WBC 5.8 08/18/2017   HGB 16.0 08/18/2017   HCT 46.9 08/18/2017   MCV 92.4 08/18/2017   PLT 274.0 08/18/2017   Lab Results  Component Value Date   NA 138 08/18/2017   K 4.5 08/18/2017   CO2 28 08/18/2017   GLUCOSE 86 08/18/2017   BUN 13 08/18/2017   CREATININE 0.82 08/18/2017   BILITOT 0.3 08/18/2017   ALKPHOS 29 (L) 08/18/2017   AST 15 08/18/2017   ALT 12 08/18/2017   PROT 7.0 08/18/2017   ALBUMIN 4.5 08/18/2017   CALCIUM 9.6 08/18/2017   ANIONGAP 10 07/15/2017   GFR 124.74 08/18/2017   Lab Results  Component Value Date   CHOL 149 08/18/2017   Lab Results  Component Value Date   HDL 43.10 08/18/2017   Lab Results  Component Value Date   LDLCALC 95 08/18/2017   Lab Results  Component Value Date   TRIG 55.0 08/18/2017   Lab Results  Component Value Date   CHOLHDL 3 08/18/2017   Lab Results  Component Value Date   HGBA1C 4.7 (L) 07/16/2017       Assessment & Plan:   Problem List Items Addressed This Visit      Other   Generalized anxiety disorder - Primary    Will increase Paxil to 40 mg daily. Continue Xanax. Follow-up 3 months. Return sooner if needed.          No orders of the defined types were placed in this encounter.    Piedad ClimesWilliam Cody Ishaaq Penna, PA-C

## 2018-01-17 NOTE — Patient Instructions (Signed)
Please stay well hydrated and get plenty of rest. Work on increasing aerobic exercise to help with weight and mood. Start the new dose ofPaxil taking daily as directed. Follow-up in 3 months.

## 2018-01-30 ENCOUNTER — Telehealth: Payer: Self-pay | Admitting: Physician Assistant

## 2018-01-30 NOTE — Telephone Encounter (Signed)
Just an FYI

## 2018-01-30 NOTE — Telephone Encounter (Signed)
Copied from CRM [phone]#[id number]. Topic: General - Other >> Jan 30, 2018  8:56 AM Angela NevinWilliams, Candice N wrote: Reason for CRM: Patients step mother, Fredderick PhenixCelia Pall (Not on DPR), called stating that she feels patient is abusing ALPRAZolam Prudy Feeler(XANAX). Merlene LaughterCelia states that while patient was visiting for thanksgiving she witnessed him mixing the medication with alcohol and was told by patients girlfriend that he has been taking up to ten tablets a day. Merlene LaughterCelia states that she and patients father Freida Busmanllen are very concerned and would like to speak with Dr. Beverely Lowabori or her medical assistant Merlene Laughter(Celia found Dr. Rennis Goldenabori's name on medication bottle). Explained that we are unable to release information to her, as she is not on DPR but would relay the message.   Celia/Step Mother cb# [phone] Allen/Father# [phone]

## 2018-01-30 NOTE — Telephone Encounter (Signed)
Thank you for making me aware. Patient is scheduled for a follow-up and due for repeat UDS at that time so we will see what shows up. CS database reviewed and he is getting Rx only from me and not filling early. Last UDS was negative for alcohol. Unfortunately I cannot go on here say from a family member alone. We will keep a close watch on this. We are going to start weaning the Xanax anyways now that he is noting improvement on a daily medication so this is good.   Cannot speak with family as [facility].

## 2018-01-30 NOTE — Telephone Encounter (Signed)
Copied from CRM #[id number]. Topic: General - Other °>> Jan 30, 2018  8:56 AM Williams, Candice N wrote: °Reason for CRM: Patients step mother, Hunter Dawson (Not on DPR), called stating that she feels patient is abusing ALPRAZolam (XANAX). Hunter states that while patient was visiting for thanksgiving she witnessed him mixing the medication with alcohol and was told by patients girlfriend that he has been taking up to ten tablets a day. Hunter states that she and patients father Allen are very concerned and would like to speak with Dr. Tabori or her medical assistant (Hunter found Dr. Tabori's name on medication bottle). Explained that we are unable to release information to her, as she is not on DPR but would relay the message.  ° °Hunter/Step Mother cb# [phone] °Allen/Father# [phone] °

## 2018-02-02 ENCOUNTER — Ambulatory Visit: Payer: Self-pay | Admitting: Clinical

## 2018-02-10 ENCOUNTER — Encounter: Payer: Self-pay | Admitting: Physician Assistant

## 2018-02-10 ENCOUNTER — Other Ambulatory Visit: Payer: Self-pay | Admitting: Physician Assistant

## 2018-02-10 NOTE — Telephone Encounter (Signed)
Last OV: 01/17/2018 Last Fill: 01/17/2018, #60 with 0 RF

## 2018-02-16 ENCOUNTER — Other Ambulatory Visit: Payer: Self-pay | Admitting: Physician Assistant

## 2018-02-16 NOTE — Telephone Encounter (Signed)
Last Filled:  01/17/18 #60, 0 Last OV: 01/17/18

## 2018-02-16 NOTE — Telephone Encounter (Signed)
Now that patient in on a daily regimen that is helping with chronic anxiety, we are going to begin weaning down on Alprazolam use. I am decreasing to 45 tablets per month and we will work our way down in the coming months. I do need him to come to office to give a UDS before additional refills.

## 2018-03-17 ENCOUNTER — Other Ambulatory Visit: Payer: Self-pay | Admitting: Physician Assistant

## 2018-03-17 NOTE — Telephone Encounter (Signed)
Last refill: 02/16/18 #45,0 Last OV: 01/17/18

## 2018-03-20 ENCOUNTER — Emergency Department (HOSPITAL_COMMUNITY)
Admission: EM | Admit: 2018-03-20 | Discharge: 2018-03-20 | Disposition: A | Discharge status: Home / Self Care | Payer: Managed Care, Other (non HMO) | Attending: Emergency Medicine | Admitting: Emergency Medicine

## 2018-03-20 ENCOUNTER — Emergency Department (HOSPITAL_COMMUNITY): Payer: Managed Care, Other (non HMO)

## 2018-03-20 ENCOUNTER — Encounter (HOSPITAL_COMMUNITY): Payer: Self-pay

## 2018-03-20 ENCOUNTER — Other Ambulatory Visit: Payer: Self-pay

## 2018-03-20 DIAGNOSIS — R030 Elevated blood-pressure reading, without diagnosis of hypertension: Secondary | ICD-10-CM | POA: Diagnosis not present

## 2018-03-20 DIAGNOSIS — K920 Hematemesis: Secondary | ICD-10-CM | POA: Diagnosis not present

## 2018-03-20 DIAGNOSIS — Z79899 Other long term (current) drug therapy: Secondary | ICD-10-CM | POA: Insufficient documentation

## 2018-03-20 DIAGNOSIS — F17220 Nicotine dependence, chewing tobacco, uncomplicated: Secondary | ICD-10-CM | POA: Insufficient documentation

## 2018-03-20 DIAGNOSIS — R1013 Epigastric pain: Secondary | ICD-10-CM | POA: Insufficient documentation

## 2018-03-20 DIAGNOSIS — F10930 Alcohol use, unspecified with withdrawal, uncomplicated: Secondary | ICD-10-CM

## 2018-03-20 DIAGNOSIS — R200 Anesthesia of skin: Secondary | ICD-10-CM | POA: Insufficient documentation

## 2018-03-20 DIAGNOSIS — F10230 Alcohol dependence with withdrawal, uncomplicated: Secondary | ICD-10-CM | POA: Insufficient documentation

## 2018-03-20 DIAGNOSIS — F419 Anxiety disorder, unspecified: Secondary | ICD-10-CM | POA: Insufficient documentation

## 2018-03-20 LAB — CBC WITH DIFFERENTIAL/PLATELET
ABS IMMATURE GRANULOCYTES: 0.07 10*3/uL (ref 0.00–0.07)
BASOS PCT: 0 %
Basophils Absolute: 0 10*3/uL (ref 0.0–0.1)
Eosinophils Absolute: 0 10*3/uL (ref 0.0–0.5)
Eosinophils Relative: 0 %
HCT: 50.4 % (ref 39.0–52.0)
HEMOGLOBIN: 17.5 g/dL — AB (ref 13.0–17.0)
Immature Granulocytes: 1 %
Lymphocytes Relative: 21 %
Lymphs Abs: 3 10*3/uL (ref 0.7–4.0)
MCH: 31.3 pg (ref 26.0–34.0)
MCHC: 34.7 g/dL (ref 30.0–36.0)
MCV: 90 fL (ref 80.0–100.0)
Monocytes Absolute: 1.3 10*3/uL — ABNORMAL HIGH (ref 0.1–1.0)
Monocytes Relative: 9 %
NEUTROS ABS: 10.1 10*3/uL — AB (ref 1.7–7.7)
Neutrophils Relative %: 69 %
Platelets: 442 10*3/uL — ABNORMAL HIGH (ref 150–400)
RBC: 5.6 MIL/uL (ref 4.22–5.81)
RDW: 12.8 % (ref 11.5–15.5)
WBC: 14.4 10*3/uL — ABNORMAL HIGH (ref 4.0–10.5)
nRBC: 0 % (ref 0.0–0.2)

## 2018-03-20 LAB — COMPREHENSIVE METABOLIC PANEL
ALK PHOS: 38 U/L (ref 38–126)
ALT: 19 U/L (ref 0–44)
AST: 53 U/L — ABNORMAL HIGH (ref 15–41)
Albumin: 4.4 g/dL (ref 3.5–5.0)
Anion gap: 14 (ref 5–15)
BUN: 7 mg/dL (ref 6–20)
CO2: 23 mmol/L (ref 22–32)
CREATININE: 0.9 mg/dL (ref 0.61–1.24)
Calcium: 9 mg/dL (ref 8.9–10.3)
Chloride: 100 mmol/L (ref 98–111)
GFR calc Af Amer: >60 mL/min (ref >60)
GFR calc non Af Amer: >60 mL/min (ref >60)
Glucose, Bld: 88 mg/dL (ref 70–99)
Potassium: 4.8 mmol/L (ref 3.5–5.1)
Sodium: 137 mmol/L (ref 135–145)
Total Bilirubin: 1.2 mg/dL (ref 0.3–1.2)
Total Protein: 7.4 g/dL (ref 6.5–8.1)

## 2018-03-20 LAB — LIPASE, BLOOD: Lipase: 25 U/L (ref 11–51)

## 2018-03-20 LAB — CBG MONITORING, ED: GLUCOSE-CAPILLARY: 78 mg/dL (ref 70–99)

## 2018-03-20 MED ORDER — PANTOPRAZOLE SODIUM 40 MG IV SOLR
40.0000 mg | Freq: Once | INTRAVENOUS | Status: AC
  Administered 2018-03-20: 40 mg via INTRAVENOUS
  Filled 2018-03-20: qty 40

## 2018-03-20 MED ORDER — LACTATED RINGERS IV BOLUS
1000.0000 mL | Freq: Once | INTRAVENOUS | Status: AC
  Administered 2018-03-20: 1000 mL via INTRAVENOUS

## 2018-03-20 MED ORDER — LORAZEPAM 2 MG/ML IJ SOLN
1.0000 mg | Freq: Once | INTRAMUSCULAR | Status: AC
  Administered 2018-03-20: 1 mg via INTRAVENOUS
  Filled 2018-03-20: qty 1

## 2018-03-20 NOTE — ED Provider Notes (Signed)
[facility] EMERGENCY DEPARTMENT Provider Note   CSN: [id number] Arrival date & time: 03/20/18  2025   History   Chief Complaint Chief Complaint  Patient presents with  . Numbness    HPI Hunter Dawson is a 23 y.o. male.  HPI  Hunter Dawson is a 23 y.o. male with PMH of ADHD and anxiety on Xanax and Paxil at home who presents with his significant other at the bedside with concern for left arm numbness earlier today and elevated heart rate and blood pressure at outpatient clinic.  He also endorsed hematemesis.  Patient reports that he drinks about 2-3 Four Lokos per day during the week and then drinks 1-2 fifths of vodka or other liquors on the weekend.  He last drank yesterday evening about 24 hours ago.  States that today he had an energy drink that contains about 300 mg of caffeine, ate chicken wings, and mozzarella sticks.  He then had an episode of a quick cough followed immediately by episode of emesis containing only food.  Shortly thereafter had another large episode of emesis that appeared to have gross blood in it.  Had a third episode of emesis that had no significant blood in it and appear to be mostly stomach contents.  He had some upper epigastric pain at that time which has significantly improved.  He has no left arm or other numbness at this time.  No recent falls or trauma and denies all chest pain or dyspnea.  Past Medical History:  Diagnosis Date  . ADHD (attention deficit hyperactivity disorder)    Treated with Adderall in middle school. None since that time. did not like the medicaiton  . Anxiety     Patient Active Problem List   Diagnosis Date Noted  . Sore throat 10/10/2017  . Visit for preventive health examination 08/17/2017  . ADD (attention deficit disorder) 02/18/2013  . Generalized anxiety disorder 02/18/2013    Past Surgical History:  Procedure Laterality Date  . PERCUTANEOUS PINNING Right 06/13/2016   Procedure: CLOSED  REDUCTION RIGHT HAND;  Surgeon: Knute Neu, MD;  Location: [facility] OR;  Service: Plastics;  Laterality: Right;        Home Medications    Prior to Admission medications   Medication Sig Start Date End Date Taking? Authorizing Provider  ALPRAZolam (XANAX) 1 MG tablet TAKE 1 TABLET(1 MG) BY MOUTH TWICE DAILY AS NEEDED FOR ANXIETY Patient taking differently: Take 1 mg by mouth 2 (two) times daily as needed for anxiety.  03/17/18  Yes Waldon Merl, PA-C  PARoxetine (PAXIL) 40 MG tablet Take 1 tablet (40 mg total) by mouth every morning. 01/17/18  Yes Waldon Merl, PA-C  pantoprazole (PROTONIX) 20 MG tablet Take 1 tablet (20 mg total) by mouth daily. 03/21/18   Shamona Wirtz, Sherryle Lis, MD    Family History Family History  Problem Relation Age of Onset  . Arthritis Father   . Heart attack Father   . Carpal tunnel syndrome Father   . Cancer Paternal Grandfather     Social History Social History   Tobacco Use  . Smoking status: Never Smoker  . Smokeless tobacco: Current User  . Tobacco comment: Dip  Substance Use Topics  . Alcohol use: Yes    Alcohol/week: 6.0 standard drinks    Types: 6 Cans of beer per week  . Drug use: Yes    Types: Marijuana     Allergies   Lactose intolerance (gi)   Review  of Systems Review of Systems  Constitutional: Negative for chills and fever.  HENT: Negative for ear pain and sore throat.   Eyes: Negative for pain and visual disturbance.  Respiratory: Negative for cough and shortness of breath.   Cardiovascular: Negative for chest pain and palpitations.  Gastrointestinal: Positive for abdominal pain. Negative for vomiting.  Genitourinary: Negative for dysuria and hematuria.  Musculoskeletal: Negative for arthralgias and back pain.  Skin: Negative for color change and rash.  Neurological: Positive for numbness. Negative for seizures and syncope.  All other systems reviewed and are negative.    Physical Exam Updated Vital Signs BP  (!) 140/96   Pulse (!) 104   Resp (!) 26   Ht 5\' 9"  (1.753 m)   Wt 81.6 kg   SpO2 97%   BMI 26.58 kg/m   Physical Exam Vitals signs and nursing note reviewed.  Constitutional:      Appearance: Normal appearance. He is well-developed. He is not diaphoretic.  HENT:     Head: Normocephalic and atraumatic.  Eyes:     Conjunctiva/sclera: Conjunctivae normal.  Neck:     Musculoskeletal: Neck supple.  Cardiovascular:     Rate and Rhythm: Regular rhythm. Tachycardia present.     Heart sounds: S1 normal and S2 normal. No murmur.  Pulmonary:     Effort: Pulmonary effort is normal. No respiratory distress.     Breath sounds: Normal breath sounds.  Abdominal:     Palpations: Abdomen is soft.     Tenderness: There is abdominal tenderness (mild) in the epigastric area. Negative signs include Murphy's sign, Rovsing's sign and McBurney's sign.  Skin:    General: Skin is warm and dry.  Neurological:     General: No focal deficit present.     Mental Status: He is alert and oriented to person, place, and time.     GCS: GCS eye subscore is 4. GCS verbal subscore is 5. GCS motor subscore is 6.     Cranial Nerves: Cranial nerves are intact.     Sensory: Sensation is intact.     Motor: Motor function is intact.     Coordination: Coordination is intact.  Psychiatric:        Behavior: Behavior is cooperative.     ED Treatments / Results  Labs (all labs ordered are listed, but only abnormal results are displayed) Labs Reviewed  CBC WITH DIFFERENTIAL/PLATELET - Abnormal; Notable for the following components:      Result Value   WBC 14.4 (*)    Hemoglobin 17.5 (*)    Platelets 442 (*)    Neutro Abs 10.1 (*)    Monocytes Absolute 1.3 (*)    All other components within normal limits  COMPREHENSIVE METABOLIC PANEL - Abnormal; Notable for the following components:   AST 53 (*)    All other components within normal limits  LIPASE, BLOOD  CBG MONITORING, ED    EKG EKG  Interpretation  Date/Time:  Monday March 20 2018 20:37:06 EST Ventricular Rate:  136 PR Interval:    QRS Duration: 91 QT Interval:  303 QTC Calculation: 456 R Axis:   90 Text Interpretation:  Sinus tachycardia Borderline right axis deviation ST elev, probable normal early repol pattern Confirmed by Virgina NorfolkAdam, [place] [phone]([id number]) on 03/20/2018 8:55:26 PM   Radiology Dg Chest 2 View  Result Date: 03/20/2018 CLINICAL DATA:  Left arm numbness.  The patient vomited today. EXAM: CHEST - 2 VIEW COMPARISON:  None. FINDINGS: Lungs are clear. Heart size is  normal. No pneumothorax or pleural effusion. No acute or focal bony abnormality. IMPRESSION: Negative chest. Electronically Signed   By: Drusilla Kanner M.D.   On: 03/20/2018 21:25    Procedures Procedures (including critical care time)  Medications Ordered in ED Medications  lactated ringers bolus 1,000 mL (0 mLs Intravenous Stopped 03/20/18 2151)  LORazepam (ATIVAN) injection 1 mg (1 mg Intravenous Given 03/20/18 2106)  pantoprazole (PROTONIX) injection 40 mg (40 mg Intravenous Given 03/20/18 2106)  lactated ringers bolus 1,000 mL (0 mLs Intravenous Stopped 03/20/18 2351)     Initial Impression / Assessment and Plan / ED Course  I have reviewed the triage vital signs and the nursing notes.  Pertinent labs & imaging results that were available during my care of the patient were reviewed by me and considered in my medical decision making (see chart for details).     MDM:  Imaging: Chest x-ray shows no acute pathology.  ED Provider Interpretation of EKG: Sinus tachycardia with a rate of 136 bpm, normal axis, no ST segment elevation or depression, pathologic T wave changes or significant interval regularity (QTC mildly prolonged at 456).  No delta wave or evidence for Brugada syndrome.  Likely early repolarization present.  Labs: Lipase 25, CMP with AST of 53 otherwise unremarkable, CBC with white count of 14 otherwise largely  unremarkable  On initial evaluation, patient appears stable. Afebrile and hemodynamically stable although tachycardic into the 130s to 140. Alert and oriented x4, pleasant, and cooperative.  Patient presents from outpatient clinic as detailed above.  On exam, patient appears slightly anxious but stable overall.  No neurologic deficits and 2+ radial pulses bilaterally.  Denies chest pain or dyspnea.  EKG shows sinus tachycardia without evidence for right heart strain or other pathology as above.  No known history of CAD and denies all drug use with exception of his prescribed Xanax which he last used 2 days ago.  Last drink about 24 hours ago.  Denies other stimulant and other drug use.  No recent falls or trauma.  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  Given 40 mg of IV Protonix.  As he last drank about 24 hours ago and has a relatively impressive history of alcohol use he was given 1 mg of IV Ativan.  Given IV LR bolus x2 and after reassessment the patient felt much better.  Heart rate thereafter in the 90s.  Was mildly hypertensive to 160s over 110s initially and normotensive thereafter.  No hallucinations or formication's and no elevated temperature.  No seizure-like activity.  No evidence for alcoholic hallucinosis, delirium tremens, but there is concern for early alcohol withdrawal.  Low suspicion for varices given his only 1 year of alcohol use and his age with no evidence for cirrhosis at this time.  Patient had photo on his phone of his emesis and there was only small amount of red blood component.  No coffee-ground nature.  Denies melena and hematochezia.  No ascites on exam.  Small amount of blood is most likely secondary to Mallory-Weiss tear.  No additional emesis in the ED.  Labs are unremarkable as documented above with exception of mild leukocytosis and elevation of AST which is likely elevated in the setting of alcohol use.  Also suspected gastritis was worsened with drinking  energy drinks and several fatty foods earlier today.  Patient was stable for discharge on reassessment.  He was counseled extensively on the risks of alcohol and benzodiazepine withdrawal.  He was offered Librium  taper if you plan to stop alcohol cold [place] and he declined to taper.  He was given extensive resources for local outpatient options.  Spoke extensively with the patient and his mother at the bedside for approximately 25 minutes at the end of visit regarding the risks including morbidity and death of benzo/alcohol withdrawal.  He reports his PCP has been weaning his Xanax.  He would like to potentially stop taking benzodiazepines and drinking.  He was interested in following up as an outpatient.  Patient was counseled to avoid all NSAIDs and other gastric irritants.  Counseled to follow-up with PCP in the next 24 hours for reevaluation and exploration of additional options for treating his anxiety if he still desires to transition from benzodiazepines.  Verbalized understanding and agreed with plan.  Given strict return precautions.  The plan for this patient was discussed with Dr. Lockie Molauratolo who voiced agreement and who oversaw evaluation and treatment of this patient.   The patient was fully informed and involved with the history taking, evaluation, workup including labs/images, and plan. The patient's concerns and questions were addressed to the patient's satisfaction and he expressed agreement with the plan to DC home.    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

## 2018-03-20 NOTE — ED Triage Notes (Signed)
Arrived POV, threw up earlier and is complaining of left arm numbness.  Pt went to outpatient facility, and was referred here for further.  Pt stated his heart rate was 140 and he was hypertensive..  Pt stated he had the flu last week.

## 2018-03-21 ENCOUNTER — Ambulatory Visit (INDEPENDENT_AMBULATORY_CARE_PROVIDER_SITE_OTHER): Payer: Managed Care, Other (non HMO) | Admitting: Physician Assistant

## 2018-03-21 ENCOUNTER — Other Ambulatory Visit: Payer: Self-pay

## 2018-03-21 ENCOUNTER — Encounter: Payer: Self-pay | Admitting: Physician Assistant

## 2018-03-21 VITALS — BP 140/94 | HR 98 | Temp 98.2°F | Resp 16 | Ht 69.0 in | Wt 181.0 lb

## 2018-03-21 DIAGNOSIS — I10 Essential (primary) hypertension: Secondary | ICD-10-CM

## 2018-03-21 DIAGNOSIS — F1010 Alcohol abuse, uncomplicated: Secondary | ICD-10-CM

## 2018-03-21 DIAGNOSIS — K2920 Alcoholic gastritis without bleeding: Secondary | ICD-10-CM

## 2018-03-21 MED ORDER — PANTOPRAZOLE SODIUM 20 MG PO TBEC: 20.0000 mg | DELAYED_RELEASE_TABLET | Freq: Every day | ORAL | 1 refills | Status: DC

## 2018-03-21 MED ORDER — NALTREXONE HCL 50 MG PO TABS: 50.0000 mg | ORAL_TABLET | Freq: Every day | ORAL | 0 refills | Status: DC

## 2018-03-21 MED ORDER — LOSARTAN POTASSIUM 50 MG PO TABS: 50.0000 mg | ORAL_TABLET | Freq: Every day | ORAL | 0 refills | Status: DC

## 2018-03-21 NOTE — Patient Instructions (Signed)
-Start the Naltrexone once daily to help with alcohol cravings.   I want you to call [facility] at [phone] or [facility] at [phone]  to discuss outpatient rehabilitation programs for alcohol abuse. This is a goal for you to have looked into prior to follow-up appointment.  I am really concerned about you, especially giving your young age and risk to your health. Please reconsider joining an AA group.   We are going to continue weaning down off of the Xanax. The next script will be for 20 tablets and we will continue down from there so begin spacing these out more.   Start the Losartan 50 mg once daily to help with BP. Keep a low salt diet. Follow-up with me in 1 week for reassessment.    DASH Eating Plan DASH stands for "Dietary Approaches to Stop Hypertension." The DASH eating plan is a healthy eating plan that has been shown to reduce high blood pressure (hypertension). It may also reduce your risk for type 2 diabetes, heart disease, and stroke. The DASH eating plan may also help with weight loss. What are tips for following this plan?  General guidelines  Avoid eating more than 2,300 mg (milligrams) of salt (sodium) a day. If you have hypertension, you may need to reduce your sodium intake to 1,500 mg a day.  Limit alcohol intake to no more than 1 drink a day for nonpregnant women and 2 drinks a day for men. One drink equals 12 oz of beer, 5 oz of wine, or 1 oz of hard liquor.  Work with your health care provider to maintain a healthy body weight or to lose weight. Ask what an ideal weight is for you.  Get at least 30 minutes of exercise that causes your heart to beat faster (aerobic exercise) most days of the week. Activities may include walking, swimming, or biking.  Work with your health care provider or diet and nutrition specialist (dietitian) to adjust your eating plan to your individual calorie needs. Reading food labels   Check food labels for the amount of  sodium per serving. Choose foods with less than 5 percent of the Daily Value of sodium. Generally, foods with less than 300 mg of sodium per serving fit into this eating plan.  To find whole grains, look for the word "whole" as the first word in the ingredient list. Shopping  Buy products labeled as "low-sodium" or "no salt added."  Buy fresh foods. Avoid canned foods and premade or frozen meals. Cooking  Avoid adding salt when cooking. Use salt-free seasonings or herbs instead of table salt or sea salt. Check with your health care provider or pharmacist before using salt substitutes.  Do not fry foods. Cook foods using healthy methods such as baking, boiling, grilling, and broiling instead.  Cook with heart-healthy oils, such as olive, canola, soybean, or sunflower oil. Meal planning  Eat a balanced diet that includes: ? 5 or more servings of fruits and vegetables each day. At each meal, try to fill half of your plate with fruits and vegetables. ? Up to 6-8 servings of whole grains each day. ? Less than 6 oz of lean meat, poultry, or fish each day. A 3-oz serving of meat is about the same size as a deck of cards. One egg equals 1 oz. ? 2 servings of low-fat dairy each day. ? A serving of nuts, seeds, or beans 5 times each week. ? Heart-healthy fats. Healthy fats called Omega-3 fatty acids are found  in foods such as flaxseeds and coldwater fish, like sardines, salmon, and mackerel.  Limit how much you eat of the following: ? Canned or prepackaged foods. ? Food that is high in trans fat, such as fried foods. ? Food that is high in saturated fat, such as fatty meat. ? Sweets, desserts, sugary drinks, and other foods with added sugar. ? Full-fat dairy products.  Do not salt foods before eating.  Try to eat at least 2 vegetarian meals each week.  Eat more home-cooked food and less restaurant, buffet, and fast food.  When eating at a restaurant, ask that your food be prepared with  less salt or no salt, if possible. What foods are recommended? The items listed may not be a complete list. Talk with your dietitian about what dietary choices are best for you. Grains Whole-grain or whole-wheat bread. Whole-grain or whole-wheat pasta. Brown rice. Orpah Cobb. Bulgur. Whole-grain and low-sodium cereals. Pita bread. Low-fat, low-sodium crackers. Whole-wheat flour tortillas. Vegetables Fresh or frozen vegetables (raw, steamed, roasted, or grilled). Low-sodium or reduced-sodium tomato and vegetable juice. Low-sodium or reduced-sodium tomato sauce and tomato paste. Low-sodium or reduced-sodium canned vegetables. Fruits All fresh, dried, or frozen fruit. Canned fruit in natural juice (without added sugar). Meat and other protein foods Skinless chicken or [place]. Ground chicken or [place]. Pork with fat trimmed off. Fish and seafood. Egg whites. Dried beans, peas, or lentils. Unsalted nuts, nut butters, and seeds. Unsalted canned beans. Lean cuts of beef with fat trimmed off. Low-sodium, lean deli meat. Dairy Low-fat (1%) or fat-free (skim) milk. Fat-free, low-fat, or reduced-fat cheeses. Nonfat, low-sodium ricotta or cottage cheese. Low-fat or nonfat yogurt. Low-fat, low-sodium cheese. Fats and oils Soft margarine without trans fats. Vegetable oil. Low-fat, reduced-fat, or light mayonnaise and salad dressings (reduced-sodium). Canola, safflower, olive, soybean, and sunflower oils. Avocado. Seasoning and other foods Herbs. Spices. Seasoning mixes without salt. Unsalted popcorn and pretzels. Fat-free sweets. What foods are not recommended? The items listed may not be a complete list. Talk with your dietitian about what dietary choices are best for you. Grains Baked goods made with fat, such as croissants, muffins, or some breads. Dry pasta or rice meal packs. Vegetables Creamed or fried vegetables. Vegetables in a cheese sauce. Regular canned vegetables (not low-sodium or  reduced-sodium). Regular canned tomato sauce and paste (not low-sodium or reduced-sodium). Regular tomato and vegetable juice (not low-sodium or reduced-sodium). Rosita Fire. Olives. Fruits Canned fruit in a light or heavy syrup. Fried fruit. Fruit in cream or butter sauce. Meat and other protein foods Fatty cuts of meat. Ribs. Fried meat. Tomasa Blase. Sausage. Bologna and other processed lunch meats. Salami. Fatback. Hotdogs. Bratwurst. Salted nuts and seeds. Canned beans with added salt. Canned or smoked fish. Whole eggs or egg yolks. Chicken or [place] with skin. Dairy Whole or 2% milk, cream, and half-and-half. Whole or full-fat cream cheese. Whole-fat or sweetened yogurt. Full-fat cheese. Nondairy creamers. Whipped toppings. Processed cheese and cheese spreads. Fats and oils Butter. Stick margarine. Lard. Shortening. Ghee. Bacon fat. Tropical oils, such as coconut, palm kernel, or palm oil. Seasoning and other foods Salted popcorn and pretzels. Onion salt, garlic salt, seasoned salt, table salt, and sea salt. Worcestershire sauce. Tartar sauce. Barbecue sauce. Teriyaki sauce. Soy sauce, including reduced-sodium. Steak sauce. Canned and packaged gravies. Fish sauce. Oyster sauce. Cocktail sauce. Horseradish that you find on the shelf. Ketchup. Mustard. Meat flavorings and tenderizers. Bouillon cubes. Hot sauce and Tabasco sauce. Premade or packaged marinades. Premade or packaged taco seasonings. Relishes.  Regular salad dressings. Where to find more information:  National Heart, Lung, and Blood Institute: PopSteam.is  American Heart Association: www.heart.org Summary  The DASH eating plan is a healthy eating plan that has been shown to reduce high blood pressure (hypertension). It may also reduce your risk for type 2 diabetes, heart disease, and stroke.  With the DASH eating plan, you should limit salt (sodium) intake to 2,300 mg a day. If you have hypertension, you may need to reduce your sodium  intake to 1,500 mg a day.  When on the DASH eating plan, aim to eat more fresh fruits and vegetables, whole grains, lean proteins, low-fat dairy, and heart-healthy fats.  Work with your health care provider or diet and nutrition specialist (dietitian) to adjust your eating plan to your individual calorie needs. This information is not intended to replace advice given to you by your health care provider. Make sure you discuss any questions you have with your health care provider. Document Released: 02/04/2011 Document Revised: 02/09/2016 Document Reviewed: 02/09/2016 Elsevier Interactive Patient Education  2019 [employer].

## 2018-03-21 NOTE — Progress Notes (Signed)
Patient presents to clinic today for ER follow-up. Patient presented to ER on 03/20/18 with c/o hematemesis following significant drinking. ER workup included EKG (sinus tachycardia with early repolarization), Labs (mild leukocytosis and very slight ALT elevation), CXR (negative). Was given IC ativan and Protonix, IV fluids. Was discharged home to follow-up closely with PCP.  Since discharge, patient endorses doing very well overall. Denies any alcohol or illicit substance use since discharge. Has been trying to keep active doing things he enjoys -- gaming, hunting, basketball. Is going to start working out again. Denies any recurrence of vomiting. Denies abdominal pain, nausea or vomiting. Is taking medication as directed. Has noted BP has been quite elevated since discharge. Is trying to watch salt. Notes occasional headache. Patient denies chest pain, palpitations, lightheadedness, dizziness, vision change.  Past Medical History:  Diagnosis Date  . ADHD (attention deficit hyperactivity disorder)    Treated with Adderall in middle school. None since that time. did not like the medicaiton  . Anxiety     Current Outpatient Medications on File Prior to Visit  Medication Sig Dispense Refill  . ALPRAZolam (XANAX) 1 MG tablet TAKE 1 TABLET(1 MG) BY MOUTH TWICE DAILY AS NEEDED FOR ANXIETY (Patient taking differently: Take 1 mg by mouth 2 (two) times daily as needed for anxiety. ) 30 tablet 0  . PARoxetine (PAXIL) 40 MG tablet Take 1 tablet (40 mg total) by mouth every morning. 30 tablet 3  . pantoprazole (PROTONIX) 20 MG tablet Take 1 tablet (20 mg total) by mouth daily. (Patient not taking: Reported on 03/21/2018) 14 tablet 1   No current facility-administered medications on file prior to visit.     Allergies  Allergen Reactions  . Lactose Intolerance (Gi)     Stomach discomfort    Family History  Problem Relation Age of Onset  . Arthritis Father   . Heart attack Father   . Carpal tunnel  syndrome Father   . Cancer Paternal Grandfather     Social History   Socioeconomic History  . Marital status: Single    Spouse name: Not on file  . Number of children: Not on file  . Years of education: Not on file  . Highest education level: Not on file  Occupational History  . Not on file  Social Needs  . Financial resource strain: Not on file  . Food insecurity:    Worry: Not on file    Inability: Not on file  . Transportation needs:    Medical: Not on file    Non-medical: Not on file  Tobacco Use  . Smoking status: Never Smoker  . Smokeless tobacco: Current User  . Tobacco comment: Dip  Substance and Sexual Activity  . Alcohol use: Yes  . Drug use: Yes    Types: Marijuana  . Sexual activity: Not Currently    Partners: Female  Lifestyle  . Physical activity:    Days per week: Not on file    Minutes per session: Not on file  . Stress: Not on file  Relationships  . Social connections:    Talks on phone: Not on file    Gets together: Not on file    Attends religious service: Not on file    Active member of club or organization: Not on file    Attends meetings of clubs or organizations: Not on file    Relationship status: Not on file  Other Topics Concern  . Not on file  Social History Narrative  .  Not on file   Review of Systems - See HPI.  All other ROS are negative.  BP (!) 140/94   Pulse 98   Temp 98.2 F (36.8 C) (Oral)   Resp 16   Ht 5\' 9"  (1.753 m)   Wt 181 lb (82.1 kg)   SpO2 99%   BMI 26.73 kg/m   Physical Exam Vitals signs reviewed.  Constitutional:      Appearance: Normal appearance.  HENT:     Head: Normocephalic and atraumatic.     Right Ear: Tympanic membrane normal.     Left Ear: Tympanic membrane normal.     Mouth/Throat:     Mouth: Mucous membranes are moist.  Neck:     Musculoskeletal: Neck supple.  Cardiovascular:     Rate and Rhythm: Regular rhythm. Tachycardia present.     Pulses: Normal pulses.     Heart sounds: Normal  heart sounds.  Pulmonary:     Effort: Pulmonary effort is normal.     Breath sounds: Normal breath sounds.  Neurological:     General: No focal deficit present.     Mental Status: He is alert and oriented to person, place, and time.  Psychiatric:        Mood and Affect: Mood normal.        Behavior: Behavior normal.    Recent Results (from the past 2160 hour(s))  CBC with Differential     Status: Abnormal   Collection Time: 03/20/18  9:00 PM  Result Value Ref Range   WBC 14.4 (H) 4.0 - 10.5 K/uL   RBC 5.60 4.22 - 5.81 MIL/uL   Hemoglobin 17.5 (H) 13.0 - 17.0 g/dL   HCT 16.1 09.6 - 04.5 %   MCV 90.0 80.0 - 100.0 fL   MCH 31.3 26.0 - 34.0 pg   MCHC 34.7 30.0 - 36.0 g/dL   RDW 40.9 81.1 - 91.4 %   Platelets 442 (H) 150 - 400 K/uL   nRBC 0.0 0.0 - 0.2 %   Neutrophils Relative % 69 %   Neutro Abs 10.1 (H) 1.7 - 7.7 K/uL   Lymphocytes Relative 21 %   Lymphs Abs 3.0 0.7 - 4.0 K/uL   Monocytes Relative 9 %   Monocytes Absolute 1.3 (H) 0.1 - 1.0 K/uL   Eosinophils Relative 0 %   Eosinophils Absolute 0.0 0.0 - 0.5 K/uL   Basophils Relative 0 %   Basophils Absolute 0.0 0.0 - 0.1 K/uL   Immature Granulocytes 1 %   Abs Immature Granulocytes 0.07 0.00 - 0.07 K/uL    Comment: Performed at [facility], [address], [place], [place] [address]  Comprehensive metabolic panel     Status: Abnormal   Collection Time: 03/20/18  9:00 PM  Result Value Ref Range   Sodium 137 135 - 145 mmol/L   Potassium 4.8 3.5 - 5.1 mmol/L    Comment: SLIGHT HEMOLYSIS   Chloride 100 98 - 111 mmol/L   CO2 23 22 - 32 mmol/L   Glucose, Bld 88 70 - 99 mg/dL   BUN 7 6 - 20 mg/dL   Creatinine, Ser 6.21 0.61 - 1.24 mg/dL   Calcium 9.0 8.9 - 30.8 mg/dL   Total Protein 7.4 6.5 - 8.1 g/dL   Albumin 4.4 3.5 - 5.0 g/dL   AST 53 (H) 15 - 41 U/L   ALT 19 0 - 44 U/L   Alkaline Phosphatase 38 38 - 126 U/L   Total Bilirubin 1.2 0.3 -  1.2 mg/dL   GFR calc non Af Amer >60 >60 mL/min   GFR calc Af Amer >60 >60  mL/min   Anion gap 14 5 - 15    Comment: Performed at [facility], [address] [place], [place] [address]  Lipase, blood     Status: None   Collection Time: 03/20/18  9:00 PM  Result Value Ref Range   Lipase 25 11 - 51 U/L    Comment: Performed at [facility], [address] [place], [place] [address]  POC CBG, ED     Status: None   Collection Time: 03/20/18 11:28 PM  Result Value Ref Range   Glucose-Capillary 78 70 - 99 mg/dL   Assessment/Plan: 1. Alcohol abuse Large amounts during binges. Drinking on his own which is very concerning. Discussed consequences of this amount of alcohol consumption. CAGE questionnaire reviewed. Scored 2/4 but does not feel angry or guilty about drinking as I feel he has very poor insight into his illness. Recommend OP rehabilitation program through [facility] or [facility]. He declines. Recommend AA and counseling but he declines feeling this will not help him. He does not like talking to others. Discussed starting Naltrexone to help with cravings. He is ok with this. Rx sent in. Will continue wean off Xanax. Continue other medications as directed. Will keep very close follow-up. F/U 1 week.  2. Acute alcoholic gastritis without hemorrhage Resolving. No current pain or recurrence of vomiting. Continue Protonix x 2 weeks. Will then reassess need.   3. Hypertension, unspecified type Start losartan 50 mg daily. DASH diet reviewed. Follow-up 1 week.   Piedad ClimesWilliam Cody Elizabeth Haff, PA-C

## 2018-03-28 ENCOUNTER — Other Ambulatory Visit: Payer: Self-pay

## 2018-03-28 ENCOUNTER — Ambulatory Visit (INDEPENDENT_AMBULATORY_CARE_PROVIDER_SITE_OTHER): Payer: Managed Care, Other (non HMO) | Admitting: Physician Assistant

## 2018-03-28 ENCOUNTER — Encounter: Payer: Self-pay | Admitting: Physician Assistant

## 2018-03-28 VITALS — BP 126/90 | HR 93 | Temp 98.0°F | Resp 16 | Ht 69.0 in | Wt 183.0 lb

## 2018-03-28 DIAGNOSIS — F1010 Alcohol abuse, uncomplicated: Secondary | ICD-10-CM

## 2018-03-28 DIAGNOSIS — I10 Essential (primary) hypertension: Secondary | ICD-10-CM

## 2018-03-29 DIAGNOSIS — F1010 Alcohol abuse, uncomplicated: Secondary | ICD-10-CM | POA: Insufficient documentation

## 2018-03-29 DIAGNOSIS — I10 Essential (primary) hypertension: Secondary | ICD-10-CM | POA: Insufficient documentation

## 2018-03-29 NOTE — Patient Instructions (Signed)
Epic down at time of discharge. AVS written in Word document and printed for patient.

## 2018-03-29 NOTE — Assessment & Plan Note (Signed)
Discussed use of the Naltrexone. He is going to give this a try. Start as directed. Will continue wean from Xanax. Follow-up 2 weeks scheduled.

## 2018-03-29 NOTE — Progress Notes (Signed)
Patient presents to clinic today for 1 week follow-up for hypertension and alcohol abuse. At last visit, patient was started on losartan 50 mg daily and naltrexone 25 mg daily. Endorses taking the losartan as directed and tolerating well. Patient denies chest pain, palpitations, lightheadedness, dizziness, vision changes or frequent headaches. Notes one episode of heartburn and non-bloody emesis just after last appointment but has been doing very well since. Denies any abdominal pain.   BP Readings from Last 3 Encounters:  03/28/18 126/90  03/21/18 (!) 140/94  03/20/18 (!) 140/96   In regards to alcohol abuse, he has not started the Naltrexone yet. Feels he does not need this. Has not had any major cravings since ER visit. Has been keeping active at the gym and spending time outside of work doing things he loves. Notes mood has been doing well overall. Still declines need for counseling or rehabilitation.   Past Medical History:  Diagnosis Date  . ADHD (attention deficit hyperactivity disorder)    Treated with Adderall in middle school. None since that time. did not like the medicaiton  . Anxiety     Current Outpatient Medications on File Prior to Visit  Medication Sig Dispense Refill  . ALPRAZolam (XANAX) 1 MG tablet TAKE 1 TABLET(1 MG) BY MOUTH TWICE DAILY AS NEEDED FOR ANXIETY (Patient taking differently: Take 1 mg by mouth 2 (two) times daily as needed for anxiety. ) 30 tablet 0  . losartan (COZAAR) 50 MG tablet Take 1 tablet (50 mg total) by mouth daily. 30 tablet 0  . PARoxetine (PAXIL) 40 MG tablet Take 1 tablet (40 mg total) by mouth every morning. 30 tablet 3  . naltrexone (DEPADE) 50 MG tablet Take 1 tablet (50 mg total) by mouth daily. (Patient not taking: Reported on 03/28/2018) 30 tablet 0   No current facility-administered medications on file prior to visit.     Allergies  Allergen Reactions  . Lactose Intolerance (Gi)     Stomach discomfort    Family History    Problem Relation Age of Onset  . Arthritis Father   . Heart attack Father   . Carpal tunnel syndrome Father   . Cancer Paternal Grandfather     Social History   Socioeconomic History  . Marital status: Single    Spouse name: Not on file  . Number of children: Not on file  . Years of education: Not on file  . Highest education level: Not on file  Occupational History  . Not on file  Social Needs  . Financial resource strain: Not on file  . Food insecurity:    Worry: Not on file    Inability: Not on file  . Transportation needs:    Medical: Not on file    Non-medical: Not on file  Tobacco Use  . Smoking status: Never Smoker  . Smokeless tobacco: Current User  . Tobacco comment: Dip  Substance and Sexual Activity  . Alcohol use: Yes  . Drug use: Yes    Types: Marijuana  . Sexual activity: Not Currently    Partners: Female  Lifestyle  . Physical activity:    Days per week: Not on file    Minutes per session: Not on file  . Stress: Not on file  Relationships  . Social connections:    Talks on phone: Not on file    Gets together: Not on file    Attends religious service: Not on file    Active member of club or organization:  Not on file    Attends meetings of clubs or organizations: Not on file    Relationship status: Not on file  Other Topics Concern  . Not on file  Social History Narrative  . Not on file   Review of Systems - See HPI.  All other ROS are negative.  BP 126/90   Pulse 93   Temp 98 F (36.7 C) (Oral)   Resp 16   Ht 5\' 9"  (1.753 m)   Wt 183 lb (83 kg)   SpO2 99%   BMI 27.02 kg/m   Physical Exam Vitals signs reviewed.  Constitutional:      Appearance: Normal appearance.  HENT:     Head: Normocephalic and atraumatic.     Right Ear: Tympanic membrane normal.     Left Ear: Tympanic membrane normal.  Eyes:     Pupils: Pupils are equal, round, and reactive to light.  Neck:     Musculoskeletal: Neck supple.  Cardiovascular:     Rate and  Rhythm: Normal rate and regular rhythm.  Pulmonary:     Effort: Pulmonary effort is normal.     Breath sounds: Normal breath sounds.  Neurological:     General: No focal deficit present.     Mental Status: He is alert and oriented to person, place, and time.  Psychiatric:        Mood and Affect: Mood normal.     Recent Results (from the past 2160 hour(s))  CBC with Differential     Status: Abnormal   Collection Time: 03/20/18  9:00 PM  Result Value Ref Range   WBC 14.4 (H) 4.0 - 10.5 K/uL   RBC 5.60 4.22 - 5.81 MIL/uL   Hemoglobin 17.5 (H) 13.0 - 17.0 g/dL   HCT 82.950.4 56.239.0 - 13.052.0 %   MCV 90.0 80.0 - 100.0 fL   MCH 31.3 26.0 - 34.0 pg   MCHC 34.7 30.0 - 36.0 g/dL   RDW 86.512.8 78.411.5 - 69.615.5 %   Platelets 442 (H) 150 - 400 K/uL   nRBC 0.0 0.0 - 0.2 %   Neutrophils Relative % 69 %   Neutro Abs 10.1 (H) 1.7 - 7.7 K/uL   Lymphocytes Relative 21 %   Lymphs Abs 3.0 0.7 - 4.0 K/uL   Monocytes Relative 9 %   Monocytes Absolute 1.3 (H) 0.1 - 1.0 K/uL   Eosinophils Relative 0 %   Eosinophils Absolute 0.0 0.0 - 0.5 K/uL   Basophils Relative 0 %   Basophils Absolute 0.0 0.0 - 0.1 K/uL   Immature Granulocytes 1 %   Abs Immature Granulocytes 0.07 0.00 - 0.07 K/uL    Comment: Performed at [facility], [address] [place], [place] [address]  Comprehensive metabolic panel     Status: Abnormal   Collection Time: 03/20/18  9:00 PM  Result Value Ref Range   Sodium 137 135 - 145 mmol/L   Potassium 4.8 3.5 - 5.1 mmol/L    Comment: SLIGHT HEMOLYSIS   Chloride 100 98 - 111 mmol/L   CO2 23 22 - 32 mmol/L   Glucose, Bld 88 70 - 99 mg/dL   BUN 7 6 - 20 mg/dL   Creatinine, Ser 4.130.90 0.61 - 1.24 mg/dL   Calcium 9.0 8.9 - 24.410.3 mg/dL   Total Protein 7.4 6.5 - 8.1 g/dL   Albumin 4.4 3.5 - 5.0 g/dL   AST 53 (H) 15 - 41 U/L   ALT 19 0 - 44 U/L  Alkaline Phosphatase 38 38 - 126 U/L   Total Bilirubin 1.2 0.3 - 1.2 mg/dL   GFR calc non Af Amer >60 >60 mL/min   GFR calc Af Amer >60 >60 mL/min    Anion gap 14 5 - 15    Comment: Performed at [facility], [address], [place], [place] [address]  Lipase, blood     Status: None   Collection Time: 03/20/18  9:00 PM  Result Value Ref Range   Lipase 25 11 - 51 U/L    Comment: Performed at [facility], [address] [place], [place] [address]  POC CBG, ED     Status: None   Collection Time: 03/20/18 11:28 PM  Result Value Ref Range   Glucose-Capillary 78 70 - 99 mg/dL    Assessment/Plan: Hypertension BP improved. Continue current regimen. Follow-up 2 weeks. Repeat labs at that time. He is to bring in his home BP cuff so we can assess accuracy of this.   Alcohol abuse Discussed use of the Naltrexone. He is going to give this a try. Start as directed. Will continue wean from Xanax. Follow-up 2 weeks scheduled.     Piedad Climes, PA-C

## 2018-03-29 NOTE — Assessment & Plan Note (Signed)
BP improved. Continue current regimen. Follow-up 2 weeks. Repeat labs at that time. He is to bring in his home BP cuff so we can assess accuracy of this.

## 2018-04-12 ENCOUNTER — Ambulatory Visit: Payer: Self-pay | Admitting: Physician Assistant

## 2018-04-17 ENCOUNTER — Other Ambulatory Visit: Payer: Self-pay

## 2018-04-17 ENCOUNTER — Encounter: Payer: Self-pay | Admitting: Physician Assistant

## 2018-04-17 ENCOUNTER — Ambulatory Visit (INDEPENDENT_AMBULATORY_CARE_PROVIDER_SITE_OTHER): Payer: Managed Care, Other (non HMO) | Admitting: Physician Assistant

## 2018-04-17 VITALS — BP 128/88 | HR 86 | Temp 97.8°F | Resp 16 | Ht 69.0 in | Wt 177.0 lb

## 2018-04-17 DIAGNOSIS — F411 Generalized anxiety disorder: Secondary | ICD-10-CM | POA: Diagnosis not present

## 2018-04-17 DIAGNOSIS — Z23 Encounter for immunization: Secondary | ICD-10-CM

## 2018-04-17 DIAGNOSIS — F1010 Alcohol abuse, uncomplicated: Secondary | ICD-10-CM | POA: Diagnosis not present

## 2018-04-17 MED ORDER — ALPRAZOLAM 1 MG PO TABS: ORAL_TABLET | ORAL | 0 refills | Status: DC

## 2018-04-17 MED ORDER — BUSPIRONE HCL 5 MG PO TABS: 5.0000 mg | ORAL_TABLET | Freq: Two times a day (BID) | ORAL | 3 refills | Status: DC

## 2018-04-17 MED ORDER — PAROXETINE HCL 40 MG PO TABS: 40.0000 mg | ORAL_TABLET | ORAL | 1 refills | Status: DC

## 2018-04-17 NOTE — Progress Notes (Signed)
Patient presents to clinic today for follow-up of alcohol use disorder, anxiety and depression. Since last visit, patient endorses he has had nothing to drink. Denies any cravings. Has been eating well, sleeping well and getting in plenty of exercise. Has not taken Naltrexone. Is taking his Paxil daily which he feels is doing a great job with anxiety. Is still having breakthrough acute anxiety on occasion. Is taking Alprazolam once daily, rarely 2 x day. We are continuing to wean off of this medication due to history of alcohol abuse. Denies depressed mood or anhedonia. Denies SI/HI.   Past Medical History:  Diagnosis Date  . ADHD (attention deficit hyperactivity disorder)    Treated with Adderall in middle school. None since that time. did not like the medicaiton  . Anxiety     Current Outpatient Medications on File Prior to Visit  Medication Sig Dispense Refill  . losartan (COZAAR) 50 MG tablet Take 1 tablet (50 mg total) by mouth daily. 30 tablet 0   No current facility-administered medications on file prior to visit.     Allergies  Allergen Reactions  . Lactose Intolerance (Gi)     Stomach discomfort    Family History  Problem Relation Age of Onset  . Arthritis Father   . Heart attack Father   . Carpal tunnel syndrome Father   . Cancer Paternal Grandfather     Social History   Socioeconomic History  . Marital status: Single    Spouse name: Not on file  . Number of children: Not on file  . Years of education: Not on file  . Highest education level: Not on file  Occupational History  . Not on file  Social Needs  . Financial resource strain: Not on file  . Food insecurity:    Worry: Not on file    Inability: Not on file  . Transportation needs:    Medical: Not on file    Non-medical: Not on file  Tobacco Use  . Smoking status: Never Smoker  . Smokeless tobacco: Current User  . Tobacco comment: Dip  Substance and Sexual Activity  . Alcohol use: Yes  . Drug  use: Yes    Types: Marijuana  . Sexual activity: Not Currently    Partners: Female  Lifestyle  . Physical activity:    Days per week: Not on file    Minutes per session: Not on file  . Stress: Not on file  Relationships  . Social connections:    Talks on phone: Not on file    Gets together: Not on file    Attends religious service: Not on file    Active member of club or organization: Not on file    Attends meetings of clubs or organizations: Not on file    Relationship status: Not on file  Other Topics Concern  . Not on file  Social History Narrative  . Not on file   Review of Systems - See HPI.  All other ROS are negative.  BP 128/88   Pulse 86   Temp 97.8 F (36.6 C) (Oral)   Resp 16   Ht 5\' 9"  (1.753 m)   Wt 177 lb (80.3 kg)   SpO2 98%   BMI 26.14 kg/m   Physical Exam Vitals signs reviewed.  Constitutional:      Appearance: Normal appearance.  HENT:     Head: Normocephalic and atraumatic.  Eyes:     Conjunctiva/sclera: Conjunctivae normal.  Neck:     Musculoskeletal:  Neck supple.  Cardiovascular:     Rate and Rhythm: Normal rate and regular rhythm.     Pulses: Normal pulses.     Heart sounds: Normal heart sounds.  Pulmonary:     Effort: Pulmonary effort is normal.     Breath sounds: Normal breath sounds.  Neurological:     General: No focal deficit present.     Mental Status: He is alert and oriented to person, place, and time.  Psychiatric:        Mood and Affect: Mood normal.    Recent Results (from the past 2160 hour(s))  CBC with Differential     Status: Abnormal   Collection Time: 03/20/18  9:00 PM  Result Value Ref Range   WBC 14.4 (H) 4.0 - 10.5 K/uL   RBC 5.60 4.22 - 5.81 MIL/uL   Hemoglobin 17.5 (H) 13.0 - 17.0 g/dL   HCT 16.150.4 09.639.0 - 04.552.0 %   MCV 90.0 80.0 - 100.0 fL   MCH 31.3 26.0 - 34.0 pg   MCHC 34.7 30.0 - 36.0 g/dL   RDW 40.912.8 81.111.5 - 91.415.5 %   Platelets 442 (H) 150 - 400 K/uL   nRBC 0.0 0.0 - 0.2 %   Neutrophils Relative % 69 %    Neutro Abs 10.1 (H) 1.7 - 7.7 K/uL   Lymphocytes Relative 21 %   Lymphs Abs 3.0 0.7 - 4.0 K/uL   Monocytes Relative 9 %   Monocytes Absolute 1.3 (H) 0.1 - 1.0 K/uL   Eosinophils Relative 0 %   Eosinophils Absolute 0.0 0.0 - 0.5 K/uL   Basophils Relative 0 %   Basophils Absolute 0.0 0.0 - 0.1 K/uL   Immature Granulocytes 1 %   Abs Immature Granulocytes 0.07 0.00 - 0.07 K/uL    Comment: Performed at [facility], [address] [place], [place] [address]  Comprehensive metabolic panel     Status: Abnormal   Collection Time: 03/20/18  9:00 PM  Result Value Ref Range   Sodium 137 135 - 145 mmol/L   Potassium 4.8 3.5 - 5.1 mmol/L    Comment: SLIGHT HEMOLYSIS   Chloride 100 98 - 111 mmol/L   CO2 23 22 - 32 mmol/L   Glucose, Bld 88 70 - 99 mg/dL   BUN 7 6 - 20 mg/dL   Creatinine, Ser 6.210.90 0.61 - 1.24 mg/dL   Calcium 9.0 8.9 - 30.810.3 mg/dL   Total Protein 7.4 6.5 - 8.1 g/dL   Albumin 4.4 3.5 - 5.0 g/dL   AST 53 (H) 15 - 41 U/L   ALT 19 0 - 44 U/L   Alkaline Phosphatase 38 38 - 126 U/L   Total Bilirubin 1.2 0.3 - 1.2 mg/dL   GFR calc non Af Amer >60 >60 mL/min   GFR calc Af Amer >60 >60 mL/min   Anion gap 14 5 - 15    Comment: Performed at [facility], [address] [place], [place] [address]  Lipase, blood     Status: None   Collection Time: 03/20/18  9:00 PM  Result Value Ref Range   Lipase 25 11 - 51 U/L    Comment: Performed at [facility], [address] [place], [place] [address]  POC CBG, ED     Status: None   Collection Time: 03/20/18 11:28 PM  Result Value Ref Range   Glucose-Capillary 78 70 - 99 mg/dL    Assessment/Plan: Need for Tdap vaccination TDaP updated today.  Generalized anxiety disorder Continue Paxil. Will add-on Buspar 5 mg BID to help further with anxiety. Continue wean down on Alprazolam. Will decreased from 30 to 15 pills per month this month. Follow-up 4-6 weeks.   Alcohol abuse Vitals stable. Seems to be doing very well.  Avoiding triggers and is getting more active for stress relief. Will monitor closely.     Piedad Climes, PA-C

## 2018-04-17 NOTE — Assessment & Plan Note (Signed)
Continue Paxil. Will add-on Buspar 5 mg BID to help further with anxiety. Continue wean down on Alprazolam. Will decreased from 30 to 15 pills per month this month. Follow-up 4-6 weeks.

## 2018-04-17 NOTE — Patient Instructions (Signed)
I am glad you are doing so well! Continue the Paxil as directed. Start the BuSpar twice daily. Xanax on rare occasion for significant breakthrough anxiety. We are continuing to wean down on this.   Follow-up with me in 4-6 weeks.  Return sooner if needed.

## 2018-04-17 NOTE — Assessment & Plan Note (Signed)
TDaP updated today. 

## 2018-04-17 NOTE — Assessment & Plan Note (Signed)
Vitals stable. Seems to be doing very well. Avoiding triggers and is getting more active for stress relief. Will monitor closely.

## 2018-05-19 ENCOUNTER — Other Ambulatory Visit: Payer: Self-pay | Admitting: Physician Assistant

## 2018-05-19 NOTE — Telephone Encounter (Signed)
Xanax last rx 04/17/18 #15 Last OV: 04/17/18 CSC: 12/08/17 UDS: 11/15/17  Please advise

## 2018-05-26 ENCOUNTER — Other Ambulatory Visit: Payer: Self-pay

## 2018-05-26 ENCOUNTER — Encounter: Payer: Self-pay | Admitting: Physician Assistant

## 2018-05-26 ENCOUNTER — Ambulatory Visit (INDEPENDENT_AMBULATORY_CARE_PROVIDER_SITE_OTHER): Payer: Managed Care, Other (non HMO) | Admitting: Physician Assistant

## 2018-05-26 ENCOUNTER — Ambulatory Visit: Payer: Self-pay | Admitting: Physician Assistant

## 2018-05-26 DIAGNOSIS — F1010 Alcohol abuse, uncomplicated: Secondary | ICD-10-CM

## 2018-05-26 DIAGNOSIS — F411 Generalized anxiety disorder: Secondary | ICD-10-CM

## 2018-05-26 NOTE — Assessment & Plan Note (Signed)
Doing much better overall. Is not having to take the BuSpar currently. Rare use of Xanax. Continue current regimen. Follow-up 3 months.

## 2018-05-26 NOTE — Progress Notes (Signed)
I have discussed the procedure for the virtual visit with the patient who has given consent to proceed with assessment and treatment.   Hunter Dawson, CMA     

## 2018-05-26 NOTE — Progress Notes (Signed)
Virtual Visit via Video Note  I connected with Hunter Dawson on 05/26/18 at  9:00 AM EDT by a video enabled telemedicine application and verified that I am speaking with the correct person using two identifiers.   I discussed the limitations of evaluation and management by telemedicine and the availability of in person appointments. The patient expressed understanding and agreed to proceed.  History of Present Illness: Anxiety -- Patient currently on a regimen of Paxil daily. At last visit, BuSpar was added due to breakthrough anxiety and in attempt to continue wean down on the Xanax. Patient endorses he is not having to take currently since he is mainly at home during this time. Very rare use of Xanax now per patient. Only for occasional issue with sleep. Has only had 1 beer of week since visit last month. Denies any significant cravings. Feels like his is doing very well.  Observations/Objective: Patient is alert and oriented x 3. Normocephalic, atraumatic. Normal non-pressured speech with logical content.  No evidence of anxious affect or mood.   Assessment and Plan: Generalized anxiety disorder Doing much better overall. Is not having to take the BuSpar currently. Rare use of Xanax. Continue current regimen. Follow-up 3 months.  Alcohol abuse 1 beer per week currently. Discussed we really need to keep at none to avoid relapse. He will work on this. Follow-up discussed.   Follow Up Instructions: Follow-up 3 months.    I discussed the assessment and treatment plan with the patient. The patient was provided an opportunity to ask questions and all were answered. The patient agreed with the plan and demonstrated an understanding of the instructions.   The patient was advised to call back or seek an in-person evaluation if the symptoms worsen or if the condition fails to improve as anticipated.  I provided 15 minutes of non-face-to-face time during this encounter.   Piedad Climes,  PA-C

## 2018-05-26 NOTE — Assessment & Plan Note (Signed)
1 beer per week currently. Discussed we really need to keep at none to avoid relapse. He will work on this. Follow-up discussed.

## 2018-06-14 ENCOUNTER — Encounter: Payer: Self-pay | Admitting: Physician Assistant

## 2018-06-19 ENCOUNTER — Other Ambulatory Visit: Payer: Self-pay | Admitting: Physician Assistant

## 2018-06-20 ENCOUNTER — Ambulatory Visit: Payer: Self-pay | Admitting: Physician Assistant

## 2018-06-20 ENCOUNTER — Other Ambulatory Visit: Payer: Self-pay | Admitting: Emergency Medicine

## 2018-06-20 ENCOUNTER — Encounter: Payer: Managed Care, Other (non HMO) | Admitting: Physician Assistant

## 2018-06-20 NOTE — Telephone Encounter (Signed)
Last refill:05/19/18 #15, 0 Last OV:05/26/18

## 2018-06-20 NOTE — Telephone Encounter (Signed)
Patient request refill for Xanax Last rx 05/19/18 #15 CSC:12/08/17 LOV: 05/26/18  Please advise

## 2018-06-21 NOTE — Progress Notes (Signed)
This encounter was created in error - please disregard.

## 2018-06-26 ENCOUNTER — Telehealth: Payer: Self-pay | Admitting: Emergency Medicine

## 2018-06-26 DIAGNOSIS — F411 Generalized anxiety disorder: Secondary | ICD-10-CM

## 2018-06-26 MED ORDER — PAROXETINE HCL 40 MG PO TABS: 40.0000 mg | ORAL_TABLET | ORAL | 1 refills | Status: DC

## 2018-06-26 NOTE — Telephone Encounter (Signed)
Copied from CRM [id number]. Topic: Quick Communication - Rx Refill/Question >> Jun 26, 2018  8:53 AM Maye Hides wrote: Medication: PARoxetine (PAXIL) 40 MG tablet  Has the patient contacted their pharmacy? Yes Madeline from [facility] states you can give verbal by calling [phone]  (Agent: If yes, when and what did the pharmacy advise?)  Preferred Pharmacy (with phone number or street name):[facility] - [place], [place] - [phone] [phone] (Phone) [phone] (Fax)   Agent: Please be advised that RX refills may take up to 3 business days. We ask that you follow-up with your pharmacy.

## 2018-07-28 ENCOUNTER — Encounter: Payer: Self-pay | Admitting: Physician Assistant

## 2018-08-25 ENCOUNTER — Encounter: Payer: Self-pay | Admitting: Physician Assistant

## 2018-08-25 ENCOUNTER — Encounter: Payer: Managed Care, Other (non HMO) | Admitting: Physician Assistant

## 2018-08-25 ENCOUNTER — Other Ambulatory Visit: Payer: Self-pay

## 2018-08-25 NOTE — Progress Notes (Signed)
Patient driving at time of visit on way to work. Cannot pull over. Appt rescheduled.

## 2018-08-25 NOTE — Progress Notes (Signed)
I have discussed the procedure for the virtual visit with the patient who has given consent to proceed with assessment and treatment.   Rece Zechman S Jenny Omdahl, CMA     

## 2018-09-13 ENCOUNTER — Other Ambulatory Visit: Payer: Self-pay | Admitting: Physician Assistant

## 2018-09-13 NOTE — Telephone Encounter (Signed)
Last refill:06/20/18 #15, 1  Last OV:05/26/18

## 2018-10-24 ENCOUNTER — Emergency Department (HOSPITAL_COMMUNITY)
Admission: EM | Admit: 2018-10-24 | Discharge: 2018-10-24 | Disposition: A | Discharge status: Home / Self Care | Payer: Managed Care, Other (non HMO) | Attending: Emergency Medicine | Admitting: Emergency Medicine

## 2018-10-24 ENCOUNTER — Ambulatory Visit: Payer: Self-pay

## 2018-10-24 ENCOUNTER — Emergency Department (HOSPITAL_COMMUNITY): Payer: Managed Care, Other (non HMO)

## 2018-10-24 ENCOUNTER — Encounter (HOSPITAL_COMMUNITY): Payer: Self-pay | Admitting: Emergency Medicine

## 2018-10-24 DIAGNOSIS — R079 Chest pain, unspecified: Secondary | ICD-10-CM | POA: Diagnosis not present

## 2018-10-24 DIAGNOSIS — F17220 Nicotine dependence, chewing tobacco, uncomplicated: Secondary | ICD-10-CM | POA: Diagnosis not present

## 2018-10-24 DIAGNOSIS — Z79899 Other long term (current) drug therapy: Secondary | ICD-10-CM | POA: Diagnosis not present

## 2018-10-24 DIAGNOSIS — I10 Essential (primary) hypertension: Secondary | ICD-10-CM | POA: Diagnosis not present

## 2018-10-24 LAB — CBC
HCT: 46.3 % (ref 39.0–52.0)
Hemoglobin: 15.5 g/dL (ref 13.0–17.0)
MCH: 30.8 pg (ref 26.0–34.0)
MCHC: 33.5 g/dL (ref 30.0–36.0)
MCV: 91.9 fL (ref 80.0–100.0)
Platelets: 304 10*3/uL (ref 150–400)
RBC: 5.04 MIL/uL (ref 4.22–5.81)
RDW: 12.6 % (ref 11.5–15.5)
WBC: 6.1 10*3/uL (ref 4.0–10.5)
nRBC: 0 % (ref 0.0–0.2)

## 2018-10-24 LAB — BASIC METABOLIC PANEL
Anion gap: 11 (ref 5–15)
BUN: 9 mg/dL (ref 6–20)
CO2: 26 mmol/L (ref 22–32)
Calcium: 9.2 mg/dL (ref 8.9–10.3)
Chloride: 103 mmol/L (ref 98–111)
Creatinine, Ser: 0.89 mg/dL (ref 0.61–1.24)
GFR calc Af Amer: >60 mL/min (ref >60)
GFR calc non Af Amer: >60 mL/min (ref >60)
Glucose, Bld: 96 mg/dL (ref 70–99)
Potassium: 4.5 mmol/L (ref 3.5–5.1)
Sodium: 140 mmol/L (ref 135–145)

## 2018-10-24 LAB — TROPONIN I (HIGH SENSITIVITY)
Troponin I (High Sensitivity): 4 ng/L (ref <18)
Troponin I (High Sensitivity): 3 ng/L (ref <18)

## 2018-10-24 MED ORDER — SODIUM CHLORIDE 0.9% FLUSH: 3.0000 mL | Freq: Once | INTRAVENOUS | Status: DC

## 2018-10-24 NOTE — Discharge Instructions (Addendum)
Please take your losartan as prescribed. Contact your primary care provider and set up an appointment for follow-up in the next few weeks.

## 2018-10-24 NOTE — ED Notes (Signed)
Patient verbalizes understanding of discharge instructions. Opportunity for questioning and answers were provided. Armband removed by staff, pt discharged from ED.  

## 2018-10-24 NOTE — Telephone Encounter (Signed)
FYI

## 2018-10-24 NOTE — ED Triage Notes (Signed)
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 He was also concerned because his BP was 150 Pt denies any fevers, sob. No acute distress.

## 2018-10-24 NOTE — ED Provider Notes (Signed)
[facility] EMERGENCY [facility] Provider Note   CSN: [id number] Arrival date & time: 10/24/18  1257     History   Chief Complaint Chief Complaint  Patient presents with  . Chest Pain    HPI Hunter SpragueCameron A Kory is a 23 y.o. male.     Patient reports onset of chest tightness, onset while at work this morning. Pressure began sub-sternally and towards the right anterior chest, then radiated to the left anterior chest. No shortness of breath or nausea. He noted that his blood pressure was elevated (150/119). Mild diaphoresis, but patient had stepped outside to confer with family and nurse. He is currently symptom free. Patient has a history of hypertension, is prescribed losartan, but is not routinely compliant. PMH significant for early heart disease on his father's side. Father with MI in his 5040's.   The history is provided by the patient. No language interpreter was used.  Chest Pain Pain location:  Substernal area Pain quality: pressure   Pain severity:  Mild Onset quality:  Sudden Progression:  Resolved Chronicity:  New Context: at rest   Relieved by:  None tried Worsened by:  Nothing Ineffective treatments:  None tried Associated symptoms: diaphoresis   Associated symptoms: no abdominal pain, no cough, no nausea, no shortness of breath, no vomiting and no weakness   Risk factors: hypertension     Past Medical History:  Diagnosis Date  . ADHD (attention deficit hyperactivity disorder)    Treated with Adderall in middle school. None since that time. did not like the medicaiton  . Anxiety     Patient Active Problem List   Diagnosis Date Noted  . Need for Tdap vaccination 04/17/2018  . Alcohol abuse 03/29/2018  . Hypertension 03/29/2018  . Visit for preventive health examination 08/17/2017  . ADD (attention deficit disorder) 02/18/2013  . Generalized anxiety disorder 02/18/2013    Past Surgical History:  Procedure Laterality Date  . PERCUTANEOUS  PINNING Right 06/13/2016   Procedure: CLOSED REDUCTION RIGHT HAND;  Surgeon: Knute NeuHarrill Coley, MD;  Location: [facility] OR;  Service: Plastics;  Laterality: Right;        Home Medications    Prior to Admission medications   Medication Sig Start Date End Date Taking? Authorizing Provider  ALPRAZolam (XANAX) 1 MG tablet TAKE 1 TABLET(1 MG) BY MOUTH TWICE DAILY AS NEEDED FOR ANXIETY Patient taking differently: Take 1 mg by mouth 2 (two) times daily as needed for anxiety.  09/14/18  Yes Waldon MerlMartin, William C, PA-C  PARoxetine (PAXIL) 40 MG tablet Take 1 tablet (40 mg total) by mouth every morning. 06/26/18  Yes Waldon MerlMartin, William C, PA-C  losartan (COZAAR) 50 MG tablet Take 1 tablet (50 mg total) by mouth daily. Patient not taking: Reported on 05/26/2018 03/21/18   Waldon MerlMartin, William C, PA-C    Family History Family History  Problem Relation Age of Onset  . Arthritis Father   . Heart attack Father   . Carpal tunnel syndrome Father   . Cancer Paternal Grandfather     Social History Social History   Tobacco Use  . Smoking status: Never Smoker  . Smokeless tobacco: Current User  . Tobacco comment: Dip  Substance Use Topics  . Alcohol use: Yes  . Drug use: Yes    Types: Marijuana     Allergies   Lactose intolerance (gi)   Review of Systems Review of Systems  Constitutional: Positive for diaphoresis.  Respiratory: Negative for cough and shortness of breath.   Cardiovascular: Positive  for chest pain.  Gastrointestinal: Negative for abdominal pain, nausea and vomiting.  Neurological: Negative for weakness.  All other systems reviewed and are negative.    Physical Exam Updated Vital Signs BP (!) 157/101   Pulse 100   Temp 99.2 F (37.3 C) (Oral)   Resp 17   SpO2 96%   Physical Exam Vitals signs and nursing note reviewed.  Constitutional:      General: He is not in acute distress.    Appearance: He is well-developed. He is not toxic-appearing.  HENT:     Head: Normocephalic.  Eyes:      Extraocular Movements: Extraocular movements intact.  Neck:     Musculoskeletal: Neck supple.  Cardiovascular:     Rate and Rhythm: Normal rate and regular rhythm.     Heart sounds: Normal heart sounds.  Pulmonary:     Effort: Pulmonary effort is normal.  Abdominal:     Palpations: Abdomen is soft.  Skin:    General: Skin is warm and dry.  Neurological:     Mental Status: He is alert and oriented to person, place, and time.  Psychiatric:        Mood and Affect: Mood normal.      ED Treatments / Results  Labs (all labs ordered are listed, but only abnormal results are displayed) Labs Reviewed  BASIC METABOLIC PANEL  CBC  TROPONIN I (HIGH SENSITIVITY)  TROPONIN I (HIGH SENSITIVITY)    EKG None  Radiology Dg Chest 2 View  Result Date: 10/24/2018 CLINICAL DATA:  Chest pain EXAM: CHEST - 2 VIEW COMPARISON:  March 20, 2018 FINDINGS: Lungs are clear. Heart size and pulmonary vascularity are normal. No adenopathy. No pneumothorax. No bone lesions. IMPRESSION: No edema or consolidation. Electronically Signed   By: Lowella Grip III M.D.   On: 10/24/2018 14:18    Procedures Procedures (including critical care time)  Medications Ordered in ED Medications  sodium chloride flush (NS) 0.9 % injection 3 mL (has no administration in time range)     Initial Impression / Assessment and Plan / ED Course  I have reviewed the triage vital signs and the nursing notes.  Pertinent labs & imaging results that were available during my care of the patient were reviewed by me and considered in my medical decision making (see chart for details).        Patient is to be discharged with recommendation to follow up with PCP in regards to today's hospital visit. Chest pain is not likely of cardiac or pulmonary etiology d/t presentation, perc negative, VSS, no tracheal deviation, no JVD or new murmur, RRR, breath sounds equal bilaterally, EKG without acute abnormalities, negative  troponin, and negative CXR. Pt has been advised to return to the ED is CP becomes exertional, associated with diaphoresis or nausea, radiates to left jaw/arm, worsens or becomes concerning in any way. Pt appears reliable for follow up and is agreeable to discharge.   Patient noted to be hypertensive in the emergency department.  No signs of hypertensive urgency.  Discussed with patient the need for close follow-up and management by their primary care physician.   Case has been discussed with  Dr. Vanita Panda who agrees with the above plan to discharge.   Final Clinical Impressions(s) / ED Diagnoses   Final diagnoses:  Nonspecific chest pain    ED Discharge Orders    None       Etta Quill, NP 10/24/18 Jamal Maes    Carmin Muskrat, MD 10/24/18 2351

## 2018-10-24 NOTE — Telephone Encounter (Signed)
Rec'd call from pt. with c/o mid sternal chest pain and pain to right of sternum.  Reported the pain radiates down the sternum to the upper stomach.  Rated pain at 5-7/10.  Reported BP 154/118, pulse 96 at 12:00 PM, just after the chest pain started.  C/o being sweaty, but stated he is sitting outside, right now.  Denied dizziness, nausea, vomiting, or shortness of breath.  Stated he has never taken the blood pressure medication that was prescribed him.  Advised with high blood pressure and continuous chest pain, needs to be evaluated in the ER.  Spoke with pt's boss, per pt. request.  Advised that pt. Should go to the ER for evaluation.  Pt's boss stated he would take him to the ER at this time.  Pt. Agreed with plan.     Reason for Disposition . [1] Chest pain lasts > 5 minutes AND [2] occurred in past 3 days (72 hours)    C/o onset of mid sternal chest pain, and pain to right of sternum with radiation down the sternum to upper stomach about 20 minutes prior to call.  Reported pain as constant and rated 5-7/10.  BP 154/118, pulse 96.  C/o sweating.  Denied nausea/vomiting, or shortness of breath.  Answer Assessment - Initial Assessment Questions 1. LOCATION: "Where does it hurt?"       Middle chest and to right of sternum 2. RADIATION: "Does the pain go anywhere else?" (e.g., into neck, jaw, arms, back)     Radiates down to the lower sternum 3. ONSET: "When did the chest pain begin?" (Minutes, hours or days)     About 20 minutes  4. PATTERN "Does the pain come and go, or has it been constant since it started?"  "Does it get worse with exertion?"      Constant  5. DURATION: "How long does it last" (e.g., seconds, minutes, hours)     Has lasted about 20 min.   6. SEVERITY: "How bad is the pain?"  (e.g., Scale 1-10; mild, moderate, or severe)    - MILD (1-3): doesn't interfere with normal activities     - MODERATE (4-7): interferes with normal activities or awakens from sleep    - SEVERE (8-10):  excruciating pain, unable to do any normal activities       Pain fluctuates 5-7 /10 7. CARDIAC RISK FACTORS: "Do you have any history of heart problems or risk factors for heart disease?" (e.g., angina, prior heart attack; diabetes, high blood pressure, high cholesterol, smoker, or strong family history of heart disease)     Hx HTN; prescribed medication for HTN, but has never taken it.  8. PULMONARY RISK FACTORS: "Do you have any history of lung disease?"  (e.g., blood clots in lung, asthma, emphysema, birth control pills)     *No Answer* 9. CAUSE: "What do you think is causing the chest pain?"     *No Answer* 10. OTHER SYMPTOMS: "Do you have any other symptoms?" (e.g., dizziness, nausea, vomiting, sweating, fever, difficulty breathing, cough)       Chest pain; elevated BP; 154/118, pulse 96 at 12:00 PM; c/o sweating; denied nausea or vomiting, or SOB.   11. PREGNANCY: "Is there any chance you are pregnant?" "When was your last menstrual period?"       n/a  Protocols used: CHEST PAIN-A-AH

## 2018-10-25 ENCOUNTER — Encounter: Payer: Self-pay | Admitting: *Deleted

## 2018-10-25 ENCOUNTER — Telehealth: Payer: Self-pay | Admitting: *Deleted

## 2018-10-25 NOTE — Telephone Encounter (Signed)
Per PCP, patient needs to be scheduled an ER follow-up on Friday or Monday.   Attempted to call patient to schedule. No answer and VM was full so I could not leave a message.  If patient calls back, he needs the ER follow-up scheduled for 8/28 or 8/31 with Elyn Aquas.

## 2018-10-27 ENCOUNTER — Ambulatory Visit: Payer: Managed Care, Other (non HMO) | Admitting: Physician Assistant

## 2018-10-27 ENCOUNTER — Encounter: Payer: Self-pay | Admitting: Physician Assistant

## 2019-01-07 DIAGNOSIS — T1491XA Suicide attempt, initial encounter: Secondary | ICD-10-CM | POA: Diagnosis present

## 2019-01-07 DIAGNOSIS — F332 Major depressive disorder, recurrent severe without psychotic features: Secondary | ICD-10-CM | POA: Diagnosis not present

## 2019-01-07 DIAGNOSIS — Z20828 Contact with and (suspected) exposure to other viral communicable diseases: Secondary | ICD-10-CM | POA: Diagnosis not present

## 2019-01-07 DIAGNOSIS — F1094 Alcohol use, unspecified with alcohol-induced mood disorder: Secondary | ICD-10-CM | POA: Diagnosis not present

## 2019-01-07 DIAGNOSIS — F411 Generalized anxiety disorder: Secondary | ICD-10-CM | POA: Diagnosis not present

## 2019-01-08 ENCOUNTER — Observation Stay (HOSPITAL_COMMUNITY)
Admission: RE | Admit: 2019-01-08 | Discharge: 2019-01-08 | Disposition: A | Discharge status: Home / Self Care | Payer: 59 | Attending: Psychiatry | Admitting: Psychiatry

## 2019-01-08 ENCOUNTER — Encounter (HOSPITAL_COMMUNITY): Payer: Self-pay

## 2019-01-08 ENCOUNTER — Other Ambulatory Visit: Payer: Self-pay

## 2019-01-08 ENCOUNTER — Ambulatory Visit (HOSPITAL_COMMUNITY): Payer: Self-pay

## 2019-01-08 DIAGNOSIS — F332 Major depressive disorder, recurrent severe without psychotic features: Secondary | ICD-10-CM | POA: Diagnosis not present

## 2019-01-08 DIAGNOSIS — F411 Generalized anxiety disorder: Secondary | ICD-10-CM | POA: Diagnosis present

## 2019-01-08 DIAGNOSIS — F1094 Alcohol use, unspecified with alcohol-induced mood disorder: Secondary | ICD-10-CM | POA: Diagnosis not present

## 2019-01-08 DIAGNOSIS — Z20828 Contact with and (suspected) exposure to other viral communicable diseases: Secondary | ICD-10-CM | POA: Insufficient documentation

## 2019-01-08 LAB — COMPREHENSIVE METABOLIC PANEL
ALT: 12 U/L (ref 0–44)
AST: 21 U/L (ref 15–41)
Albumin: 4.1 g/dL (ref 3.5–5.0)
Alkaline Phosphatase: 48 U/L (ref 38–126)
Anion gap: 9 (ref 5–15)
BUN: 10 mg/dL (ref 6–20)
CO2: 26 mmol/L (ref 22–32)
Calcium: 9.4 mg/dL (ref 8.9–10.3)
Chloride: 104 mmol/L (ref 98–111)
Creatinine, Ser: 0.78 mg/dL (ref 0.61–1.24)
GFR calc Af Amer: >60 mL/min (ref >60)
GFR calc non Af Amer: >60 mL/min (ref >60)
Glucose, Bld: 102 mg/dL — ABNORMAL HIGH (ref 70–99)
Potassium: 4.3 mmol/L (ref 3.5–5.1)
Sodium: 139 mmol/L (ref 135–145)
Total Bilirubin: 0.4 mg/dL (ref 0.3–1.2)
Total Protein: 7.4 g/dL (ref 6.5–8.1)

## 2019-01-08 LAB — CBC
HCT: 45.6 % (ref 39.0–52.0)
Hemoglobin: 15.2 g/dL (ref 13.0–17.0)
MCH: 30.6 pg (ref 26.0–34.0)
MCHC: 33.3 g/dL (ref 30.0–36.0)
MCV: 91.8 fL (ref 80.0–100.0)
Platelets: 311 10*3/uL (ref 150–400)
RBC: 4.97 MIL/uL (ref 4.22–5.81)
RDW: 12.6 % (ref 11.5–15.5)
WBC: 5.9 10*3/uL (ref 4.0–10.5)
nRBC: 0 % (ref 0.0–0.2)

## 2019-01-08 LAB — SARS CORONAVIRUS 2 BY RT PCR (HOSPITAL ORDER, PERFORMED IN ~~LOC~~ HOSPITAL LAB): SARS Coronavirus 2: NEGATIVE

## 2019-01-08 MED ORDER — ACETAMINOPHEN 325 MG PO TABS: 650.0000 mg | ORAL_TABLET | Freq: Four times a day (QID) | ORAL | Status: DC | PRN

## 2019-01-08 MED ORDER — PAROXETINE HCL 10 MG PO TABS
40.0000 mg | ORAL_TABLET | ORAL | Status: DC
  Administered 2019-01-08: 09:00:00 40 mg via ORAL
  Filled 2019-01-08: qty 4

## 2019-01-08 MED ORDER — ALUM & MAG HYDROXIDE-SIMETH 200-200-20 MG/5ML PO SUSP: 30.0000 mL | ORAL | Status: DC | PRN

## 2019-01-08 MED ORDER — TRAZODONE HCL 50 MG PO TABS: 50.0000 mg | ORAL_TABLET | Freq: Every evening | ORAL | Status: DC | PRN

## 2019-01-08 MED ORDER — MAGNESIUM HYDROXIDE 400 MG/5ML PO SUSP: 30.0000 mL | Freq: Every day | ORAL | Status: DC | PRN

## 2019-01-08 MED ORDER — HYDROXYZINE HCL 25 MG PO TABS: 25.0000 mg | ORAL_TABLET | Freq: Three times a day (TID) | ORAL | Status: DC | PRN

## 2019-01-08 NOTE — BH Assessment (Addendum)
Assessment Note  Hunter Dawson is an 23 y.o. single male who presents voluntarily to [facility] via Event organiser. Pt has a history of depression and substance use. He reports tonight he had a conflict with his girlfriend due to Pt's relationship with another woman. Pt says he threatened to kill himself and put a pizza knife to his wrist. Pt's girlfriend contacted Event organiser. Pt reports he has frequent "episodes" where "my mind goes to bad stuff", meaning depressive thoughts and thoughts of suicide. He describes having a low threshold for frustration. Pt denies previous suicide attempts, however Pt's medical record indicates he has threatened to wreck his motorcycle and threatened to shoot himself in the head. During assessment, when Pt referred to suicide he pointed his finger to head to indicate shooting himself. Pt says he does have a gun. Pt also reports his uncle completed suicide by shooting himself in the head. Pt acknowledges he still has suicidal thoughts. He denies problems with sleep or appetite. He acknowledges depressive symptoms including crying episodes, irritability and feelings of hopelessness and worthlessness. He denies homicidal ideation or history of violence. He denies any history of psychotic symptoms.  Pt reports he regularly uses alcohol and marijuana. He reports he use to drink alcohol more frequently but when he does drink it is to intoxication. Pt reports drinking approximately 8 "IPA"s tonight. Pt says he has a history of using cocaine but does so infrequently, his last use one month ago.  Pt identifies conflict with his girlfriend as his only stressor. He says he works for a [employer] and enjoys his job. Pt denies history of abuse or trauma. He denies current legal problems. He says he is prescribed Paxil by his primary care provider, Elyn Aquas, PA-C with [employer]. He says he is not currently seeing a psychiatrist or therapist. He reports one  previous inpatient psychiatric admission to [facility] in May 2019.  Pt is casually dressed and well-groomed. He is alert and oriented x4. Pt speaks in a clear tone, at moderate volume and normal pace. Motor behavior appears normal. Eye contact is good. Pt's mood is depressed and affect is congruent with mood. Thought process is coherent and relevant. There is no indication Pt is currently responding to internal stimuli or experiencing delusional thought content. Pt was pleasant and cooperative throughout assessment. He says he is willing to sign voluntarily into a psychiatric facility.   Diagnosis:  F33.2 Major depressive disorder, Recurrent episode, Severe F10.20 Alcohol use disorder, Moderate F12.20 Cannabis use disorder, Severe  Past Medical History:  Past Medical History:  Diagnosis Date  . ADHD (attention deficit hyperactivity disorder)    Treated with Adderall in middle school. None since that time. did not like the medicaiton  . Anxiety     Past Surgical History:  Procedure Laterality Date  . PERCUTANEOUS PINNING Right 06/13/2016   Procedure: CLOSED REDUCTION RIGHT HAND;  Surgeon: Dayna Barker, MD;  Location: [place];  Service: Plastics;  Laterality: Right;    Family History:  Family History  Problem Relation Age of Onset  . Arthritis Father   . Heart attack Father   . Carpal tunnel syndrome Father   . Cancer Paternal Grandfather     Social History:  reports that he has never smoked. He uses smokeless tobacco. He reports current alcohol use. He reports current drug use. Drug: Marijuana.  Additional Social History:  Alcohol / Drug Use Pain Medications: Denies abuse Prescriptions: Denies abuse Over the Counter: Denies  abuse History of alcohol / drug use?: Yes Longest period of sobriety (when/how long): "I don't know. Not long" Negative Consequences of Use: (Pt denies) Withdrawal Symptoms: (Pt denies) Substance #1 Name of Substance 1: Alcohol 1 - Age of First Use: 14 1  - Amount (size/oz): up to 24 cans of beer 1 - Frequency: 1-2 times per month 1 - Duration: Ongoing 1 - Last Use / Amount: 01/07/2019, 8 cans of beer Substance #2 Name of Substance 2: Marijuana 2 - Age of First Use: 14 2 - Amount (size/oz): 0.5 grams 2 - Frequency: 1-2 times per week 2 - Duration: Ongoing 2 - Last Use / Amount: 01/07/2019  CIWA: CIWA-Ar BP: 133/88 Pulse Rate: (!) 107(Nurse was notified) COWS:    Allergies:  Allergies  Allergen Reactions  . Lactose Intolerance (Gi) Other (See Comments)    Stomach discomfort    Home Medications: (Not in a hospital admission)   OB/GYN Status:  No LMP for male patient.  General Assessment Data Location of Assessment: [facility] Assessment Services TTS Assessment: In system Is this a Tele or Face-to-Face Assessment?: Face-to-Face Is this an Initial Assessment or a Re-assessment for this encounter?: Initial Assessment Patient Accompanied by:: Other(Law enforcement) Language Other than English: No Living Arrangements: Other (Comment)(Lives alone) What gender do you identify as?: Male Marital status: Single Maiden name: NA Pregnancy Status: No Living Arrangements: Alone Can pt return to current living arrangement?: Yes Admission Status: Voluntary Is patient capable of signing voluntary admission?: Yes Referral Source: Self/Family/Friend Insurance type: Product/process development scientist Exam [facility] Walk-in ONLY) Medical Exam completed: Yes(Jason Allyson Sabal, FNP)  Crisis Care Plan Living Arrangements: Alone Legal Guardian: Other:(Self) Name of Psychiatrist: None Name of Therapist: None  Education Status Is patient currently in school?: No Is the patient employed, unemployed or receiving disability?: Employed  Risk to self with the past 6 months Suicidal Ideation: Yes-Currently Present Has patient been a risk to self within the past 6 months prior to admission? : Yes Suicidal Intent: Yes-Currently Present Has patient had any suicidal  intent within the past 6 months prior to admission? : Yes Is patient at risk for suicide?: Yes Suicidal Plan?: Yes-Currently Present Has patient had any suicidal plan within the past 6 months prior to admission? : Yes Specify Current Suicidal Plan: Pt threatened to cut his wrist with pizza knife tonight Access to Means: Yes Specify Access to Suicidal Means: Had pizza knife to wrist What has been your use of drugs/alcohol within the last 12 months?: Pt reports using alcohol and marijuana Previous Attempts/Gestures: Yes How many times?: 1 Other Self Harm Risks: Pt denies Triggers for Past Attempts: Other personal contacts Intentional Self Injurious Behavior: None Family Suicide History: Yes(Uncle died by suicide) Recent stressful life event(s): Conflict (Comment)(Conflict with girlfriend) Persecutory voices/beliefs?: No Depression: Yes Depression Symptoms: Despondent, Tearfulness, Feeling angry/irritable Substance abuse history and/or treatment for substance abuse?: Yes Suicide prevention information given to non-admitted patients: Not applicable  Risk to Others within the past 6 months Homicidal Ideation: No Does patient have any lifetime risk of violence toward others beyond the six months prior to admission? : No Thoughts of Harm to Others: No Current Homicidal Intent: No Current Homicidal Plan: No Access to Homicidal Means: No Identified Victim: None History of harm to others?: No Assessment of Violence: None Noted Violent Behavior Description: Pt denies history of violence Does patient have access to weapons?: Yes (Comment)(Pt has access to gun) Criminal Charges Pending?: No Does patient have a court date: No Is  patient on probation?: No  Psychosis Hallucinations: None noted Delusions: None noted  Mental Status Report Appearance/Hygiene: Other (Comment)(Casually dressed, well-groomed) Eye Contact: Good Motor Activity: Freedom of movement Speech: Logical/coherent Level  of Consciousness: Alert, Other (Comment)(Possibly intoxicated) Mood: Depressed Affect: Depressed Anxiety Level: None Thought Processes: Coherent, Relevant Judgement: Partial Orientation: Person, Place, Time, Situation Obsessive Compulsive Thoughts/Behaviors: None  Cognitive Functioning Concentration: Fair Memory: Recent Intact, Remote Intact Is patient IDD: No Insight: Fair Impulse Control: Poor Appetite: Good Have you had any weight changes? : No Change Sleep: No Change Total Hours of Sleep: 8 Vegetative Symptoms: None  ADLScreening [facility] Assessment Services) Patient's cognitive ability adequate to safely complete daily activities?: Yes Patient able to express need for assistance with ADLs?: Yes Independently performs ADLs?: Yes (appropriate for developmental age)  Prior Inpatient Therapy Prior Inpatient Therapy: Yes Prior Therapy Dates: 06/2017 [facility] Reason for Treatment: Suicidal ideation  Prior Outpatient Therapy Prior Outpatient Therapy: No Does patient have an ACCT team?: No Does patient have Intensive In-House Services?  : No Does patient have [facility] services? : No Does patient have P4CC services?: No  ADL Screening (condition at time of admission) Patient's cognitive ability adequate to safely complete daily activities?: Yes Is the patient deaf or have difficulty hearing?: No Does the patient have difficulty seeing, even when wearing glasses/contacts?: No Does the patient have difficulty concentrating, remembering, or making decisions?: No Patient able to express need for assistance with ADLs?: Yes Does the patient have difficulty dressing or bathing?: No Independently performs ADLs?: Yes (appropriate for developmental age) Does the patient have difficulty walking or climbing stairs?: No Weakness of Legs: None Weakness of Arms/Hands: None  Home Assistive Devices/Equipment Home Assistive Devices/Equipment: None     Abuse/Neglect Assessment (Assessment to be complete while patient is alone) Abuse/Neglect Assessment Can Be Completed: Yes Physical Abuse: Denies Verbal Abuse: Denies Sexual Abuse: Denies Exploitation of patient/patient's resources: Denies Self-Neglect: Denies     Merchant navy officerAdvance Directives (For Healthcare) Does Patient Have a Medical Advance Directive?: No Would patient like information on creating a medical advance directive?: No - Patient declined          Disposition: Gave clinical report to Nira ConnJason Berry, FNP who completed MSE and recommended Pt be admitted to observation unit and evaluated by psychiatry later today.  Disposition Initial Assessment Completed for this Encounter: Yes Disposition of Patient: Admit Type of inpatient treatment program: (Observation Unit)  On Site Evaluation by:  Nira ConnJason Berry, FNP Reviewed with Physician:    Pamalee LeydenFord Ellis Axle Parfait Jr, [facility], [facility] Triage Specialist [phone]  Patsy BaltimoreWarrick Jr, Harlin RainFord Ellis 01/08/2019 12:12 AM

## 2019-01-08 NOTE — H&P (Signed)
Behavioral Health Medical Screening Exam  Hunter Dawson is an 23 y.o. male.   Psychiatric Specialty Exam: Physical Exam  Constitutional: He is oriented to person, place, and time. He appears well-developed and well-nourished. No distress.  HENT:  Head: Normocephalic and atraumatic.  Right Ear: External ear normal.  Left Ear: External ear normal.  Eyes: Pupils are equal, round, and reactive to light. Right eye exhibits no discharge. Left eye exhibits no discharge. No scleral icterus.  Respiratory: Effort normal. No respiratory distress.  Musculoskeletal: Normal range of motion.  Neurological: He is alert and oriented to person, place, and time.  Skin: He is not diaphoretic.  Psychiatric: His mood appears anxious. He is not withdrawn and not actively hallucinating. Thought content is not paranoid and not delusional. He expresses impulsivity and inappropriate judgment. He exhibits a depressed mood. He expresses suicidal ideation. He expresses no homicidal ideation. He expresses no suicidal plans.    Review of Systems  Constitutional: Negative for chills, diaphoresis, fever, malaise/fatigue and weight loss.  Respiratory: Negative for cough and shortness of breath.   Cardiovascular: Negative for chest pain.  Gastrointestinal: Negative for diarrhea and nausea.  Psychiatric/Behavioral: Positive for depression and suicidal ideas. The patient is nervous/anxious and has insomnia.     Blood pressure 133/88, pulse (!) 107, temperature 97.9 F (36.6 C), temperature source Oral, resp. rate 20, SpO2 98 %.There is no height or weight on file to calculate BMI.  General Appearance: Casual and Well Groomed  Eye Contact:  Good  Speech:  Clear and Coherent  Volume:  Normal  Mood:  Anxious, Depressed and Worthless  Affect:  Congruent and Depressed  Thought Process:  Coherent, Linear and Descriptions of Associations: Intact  Orientation:  Full (Time, Place, and Person)  Thought Content:  Logical  and Hallucinations: None  Suicidal Thoughts:  Yes.  without intent/plan  Homicidal Thoughts:  No  Memory:  Immediate;   Good Recent;   Good  Judgement:  Fair  Insight:  Lacking  Psychomotor Activity:  Normal  Concentration:  Concentration: Good and Attention Span: Good  Recall:  Good  Fund of Knowledge:  Good  Language:  Good  Akathisia:  Negative  Handed:  Right  AIMS (if indicated):     Assets:  Communication Skills Desire for Improvement Financial Resources/Insurance Leisure Time Physical Health  ADL's:  Intact  Cognition:  WNL  Sleep:         Blood pressure 133/88, pulse (!) 107, temperature 97.9 F (36.6 C), temperature source Oral, resp. rate 20, SpO2 98 %.  Recommendations:  Based on my evaluation the patient does not appear to have an emergency medical condition.  Rozetta Nunnery, NP 01/08/2019, 5:39 AM

## 2019-01-08 NOTE — Plan of Care (Signed)
[place] Observation Crisis Plan  Reason for Crisis Plan:  Crisis Stabilization   Plan of Care:  Referral for Telepsychiatry/Psychiatric Consult  Family Support:      Current Living Environment:  Living Arrangements: Alone  Insurance:   Hospital Account    Name Acct ID Class Status Primary Coverage   Hunter Dawson, Hunter Dawson [id number] [place]        Guarantor Account (for Hospital Account [id number])    Name Relation to Pt Service Area Active? Acct Type   Hunter Dawson Self [facility] Yes Behavioral Health   Address Phone       [address] [place], [place] [address] [phone](H) [phone])          Coverage Information (for Hospital Account [id number])    F/O Payor/Plan Precert #   CIGNA/[employer] [place] #   Hunter, Dawson [id number]   Address Phone   [address] [place] [address] [phone]      Legal Guardian:  Legal Guardian: Other:(Self)  Primary Care Provider:  Delorse Dawson  Current Outpatient Providers:  none  Psychiatrist:  Name of Psychiatrist: None  Counselor/Therapist:  Name of Therapist: None  Compliant with Medications:  Yes  Additional Information:   Hunter Dawson 11/9/20201:06 AM

## 2019-01-08 NOTE — Patient Outreach (Signed)
CPSS met with Pt and processed with Pt about how he was doing and to also process with Pt about the OutPatient program that Pt may benefit from attending. CPSS offered information for Pt to read over as well as the application for Pt. CPSS prompted Pt to look into getting involved with the OutPatient program. CPSS left contact information for Pt if he needed assistance in the community.

## 2019-01-08 NOTE — Progress Notes (Signed)
Admitted this 23 y/o male patient with DX of Severe recurrent major depression. Patient reports suicidal thoughts after conflict with his GF. He made a superficial cut to his left wrist.He denies current plan or intent but adds that he has guns. Cooperative on admission. Seems anxious and depressed. Is contracting for safety. No physical complaints.

## 2019-01-08 NOTE — H&P (Addendum)
BH Observation Unit Provider Admission PAA/H&P  Patient Identification: Hunter Dawson MRN:  [id number] Date of Evaluation:  01/08/2019 Chief Complaint:  Suicidal Principal Diagnosis: Severe recurrent major depression without psychotic features (HCC) Diagnosis:  Principal Problem:   Severe recurrent major depression without psychotic features (HCC) Active Problems:   Generalized anxiety disorder  History of Present Illness:   TTS Assessment:   Hunter Dawson is an 23 y.o. single male who presents voluntarily to [facility] via Patent examiner. Pt has a history of depression and substance use. He reports tonight he had a conflict with his girlfriend due to Pt's relationship with another woman. Pt says he threatened to kill himself and put a pizza knife to his wrist. Pt's girlfriend contacted Patent examiner. Pt reports he has frequent "episodes" where "my mind goes to bad stuff", meaning depressive thoughts and thoughts of suicide. He describes having a low threshold for frustration. Pt denies previous suicide attempts, however Pt's medical record indicates he has threatened to wreck his motorcycle and threatened to shoot himself in the head. During assessment, when Pt referred to suicide he pointed his finger to head to indicate shooting himself. Pt says he does have a gun. Pt also reports his uncle completed suicide by shooting himself in the head. Pt acknowledges he still has suicidal thoughts. He denies problems with sleep or appetite. He acknowledges depressive symptoms including crying episodes, irritability and feelings of hopelessness and worthlessness. He denies homicidal ideation or history of violence. He denies any history of psychotic symptoms.  Pt reports he regularly uses alcohol and marijuana. He reports he use to drink alcohol more frequently but when he does drink it is to intoxication. Pt reports drinking approximately 8 "IPA"s tonight. Pt says he has a history of using cocaine  but does so infrequently, his last use one month ago.  Pt identifies conflict with his girlfriend as his only stressor. 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 Pt denies history of abuse or trauma. He denies current legal problems. He says he is prescribed Paxil by his primary care provider, Hunter Cogan, PA-C with [employer]. He says he is not currently seeing a psychiatrist or therapist. He reports one previous inpatient psychiatric admission to [facility] in May 2019.  Evaluation on Unit: Reviewed TTS assessment and validated with patient. Patient states that he has argument with his girlfriend and grabbed a pizza knife and threatened to kill himself. On evaluation patient is alert and oriented x 4, pleasant, and cooperative. Speech is clear and coherent. Mood is depressed and anxious. Affect is congruent with mood. Thought process is coherent and thought content is logical. Reports suicidal ideations at this time;however, he reported to TTS a plan to shoot himself. Patient has access to guns.  Denies homicidal ideations. Reports that he occassionally drinks alcohol.  Denies audiovisual hallucinations. No indication that patient is responding to internal stimuli.     Associated Signs/Symptoms:  Depression Symptoms:  depressed mood, anhedonia, insomnia, psychomotor agitation, feelings of worthlessness/guilt, hopelessness, suicidal thoughts without plan, anxiety, (Hypo) Manic Symptoms:  Impulsivity, Anxiety Symptoms:  Excessive Worry, Psychotic Symptoms:  Denies PTSD Symptoms: Negative Total Time spent with patient: 20 minutes  Past Psychiatric History: Major Depressive Disorder  Is the patient at risk to self? Yes.    Has the patient been a risk to self in the past 6 months? No.  Has the patient been a risk to self within the distant past? Yes.  Is the patient a risk to others? No.  Has the patient been a risk to others in the past 6 months? No.   Has the patient been a risk to others within the distant past? No.   Prior Inpatient Therapy: Prior Inpatient Therapy: Yes Prior Therapy Dates: 06/2017 Prior Therapy Facilty/Provider(s): [facility] Reason for Treatment: Suicidal ideation Prior Outpatient Therapy: Prior Outpatient Therapy: No Does patient have an ACCT team?: No Does patient have Intensive In-House Services?  : No Does patient have [facility] services? : No Does patient have P4CC services?: No  Alcohol Screening:   Substance Abuse History in the last 12 months:  Yes.   Consequences of Substance Abuse: Negative Previous Psychotropic Medications: Yes  Psychological Evaluations: No  Past Medical History:  Past Medical History:  Diagnosis Date  . ADHD (attention deficit hyperactivity disorder)    Treated with Adderall in middle school. None since that time. did not like the medicaiton  . Anxiety     Past Surgical History:  Procedure Laterality Date  . PERCUTANEOUS PINNING Right 06/13/2016   Procedure: CLOSED REDUCTION RIGHT HAND;  Surgeon: Dayna Barker, MD;  Location: [place];  Service: Plastics;  Laterality: Right;   Family History:  Family History  Problem Relation Age of Onset  . Arthritis Father   . Heart attack Father   . Carpal tunnel syndrome Father   . Cancer Paternal Grandfather    Family Psychiatric History: No pertinent history Tobacco Screening:   Social History:  Social History   Substance and Sexual Activity  Alcohol Use Yes     Social History   Substance and Sexual Activity  Drug Use Yes  . Types: Marijuana    Additional Social History: Marital status: Single    Pain Medications: Denies abuse Prescriptions: Denies abuse Over the Counter: Denies abuse History of alcohol / drug use?: Yes Longest period of sobriety (when/how long): "I don't know. Not long" Negative Consequences of Use: (Pt denies) Withdrawal Symptoms: (Pt denies) Name of Substance 1: Alcohol 1 - Age of First Use: 14 1  - Amount (size/oz): up to 24 cans of beer 1 - Frequency: 1-2 times per month 1 - Duration: Ongoing 1 - Last Use / Amount: 01/07/2019, 8 cans of beer Name of Substance 2: Marijuana 2 - Age of First Use: 14 2 - Amount (size/oz): 0.5 grams 2 - Frequency: 1-2 times per week 2 - Duration: Ongoing 2 - Last Use / Amount: 01/07/2019                Allergies:   Allergies  Allergen Reactions  . Lactose Intolerance (Gi) Other (See Comments)    Stomach discomfort   Lab Results: No results found for this or any previous visit (from the past 48 hour(s)).  Blood Alcohol level:  Lab Results  Component Value Date   ETH <10 07/15/2017   ETH 115 (H) 83/15/1761    Metabolic Disorder Labs:  Lab Results  Component Value Date   HGBA1C 4.7 (L) 07/16/2017   MPG 88.19 07/16/2017   No results found for: PROLACTIN Lab Results  Component Value Date   CHOL 149 08/18/2017   TRIG 55.0 08/18/2017   HDL 43.10 08/18/2017   CHOLHDL 3 08/18/2017   VLDL 11.0 08/18/2017   LDLCALC 95 08/18/2017   LDLCALC 103 (H) 07/16/2017    Current Medications: Current Outpatient Medications  Medication Sig Dispense Refill  . ALPRAZolam (XANAX) 1 MG tablet TAKE 1 TABLET(1 MG) BY MOUTH TWICE DAILY AS NEEDED  FOR ANXIETY (Patient taking differently: Take 1 mg by mouth 2 (two) times daily as needed for anxiety. ) 15 tablet 0  . losartan (COZAAR) 50 MG tablet Take 1 tablet (50 mg total) by mouth daily. (Patient not taking: Reported on 05/26/2018) 30 tablet 0  . PARoxetine (PAXIL) 40 MG tablet Take 1 tablet (40 mg total) by mouth every morning. 90 tablet 1   No current facility-administered medications for this encounter.    PTA Medications: Medications Prior to Admission  Medication Sig Dispense Refill Last Dose  . ALPRAZolam (XANAX) 1 MG tablet TAKE 1 TABLET(1 MG) BY MOUTH TWICE DAILY AS NEEDED FOR ANXIETY (Patient taking differently: Take 1 mg by mouth 2 (two) times daily as needed for anxiety. ) 15 tablet 0    . losartan (COZAAR) 50 MG tablet Take 1 tablet (50 mg total) by mouth daily. 30 tablet 0   . PARoxetine (PAXIL) 40 MG tablet Take 1 tablet (40 mg total) by mouth every morning. 90 tablet 1 01/07/2019    Musculoskeletal: Strength & Muscle Tone: within normal limits Gait & Station: normal Patient leans: N/A  Psychiatric Specialty Exam: Physical Exam  Constitutional: He is oriented to person, place, and time. He appears well-developed and well-nourished. No distress.  HENT:  Head: Normocephalic and atraumatic.  Right Ear: External ear normal.  Left Ear: External ear normal.  Eyes: Pupils are equal, round, and reactive to light. Right eye exhibits no discharge. Left eye exhibits no discharge. No scleral icterus.  Respiratory: Effort normal. No respiratory distress.  Musculoskeletal: Normal range of motion.  Neurological: He is alert and oriented to person, place, and time.  Skin: He is not diaphoretic.  Psychiatric: His mood appears anxious. He is not withdrawn and not actively hallucinating. Thought content is not paranoid and not delusional. He expresses impulsivity and inappropriate judgment. He exhibits a depressed mood. He expresses suicidal ideation. He expresses no homicidal ideation. He expresses no suicidal plans.    Review of Systems  Constitutional: Negative for chills, diaphoresis, fever, malaise/fatigue and weight loss.  Respiratory: Negative for cough and shortness of breath.   Cardiovascular: Negative for chest pain.  Gastrointestinal: Negative for diarrhea and nausea.  Psychiatric/Behavioral: Positive for depression and suicidal ideas. The patient is nervous/anxious and has insomnia.     Blood pressure 133/88, pulse (!) 107, temperature 97.9 F (36.6 C), temperature source Oral, resp. rate 20, SpO2 98 %.There is no height or weight on file to calculate BMI.  General Appearance: Casual and Well Groomed  Eye Contact:  Good  Speech:  Clear and Coherent  Volume:  Normal   Mood:  Anxious, Depressed and Worthless  Affect:  Congruent and Depressed  Thought Process:  Coherent, Linear and Descriptions of Associations: Intact  Orientation:  Full (Time, Place, and Person)  Thought Content:  Logical and Hallucinations: None  Suicidal Thoughts:  Yes.  without intent/plan  Homicidal Thoughts:  No  Memory:  Immediate;   Good Recent;   Good  Judgement:  Fair  Insight:  Lacking  Psychomotor Activity:  Normal  Concentration:  Concentration: Good and Attention Span: Good  Recall:  Good  Fund of Knowledge:  Good  Language:  Good  Akathisia:  Negative  Handed:  Right  AIMS (if indicated):     Assets:  Communication Skills Desire for Improvement Financial Resources/Insurance Leisure Time Physical Health  ADL's:  Intact  Cognition:  WNL  Sleep:         Treatment Plan Summary: Daily contact with  patient to assess and evaluate symptoms and progress in treatment and Medication management  Observation Level/Precautions:  15 minute checks Laboratory:  CBC Chemistry Profile UDS Psychotherapy:  Individual Medications:  Paxil 40 mg daily for MDD/Anxiety Hydroxyzine 25 mg TID prn anxiety Consultations:  As needed Discharge Concerns:  safety Estimated LOS:<24 hours Other:      Jackelyn PolingJason A Markea Ruzich, NP 11/9/202012:15 AM

## 2019-01-08 NOTE — Progress Notes (Signed)
D: Pt A & O X 4. Presents fidgety, pleasant with logical speech on interactions. Admits to etoh use being his stressors in his relationships / altercations with his girlfriend / family. Pt denies SI, HI, AVH and pain when assessed. Pt d/c home as ordered. Picked up in lobby by his mother.  A: Pt D/C instructions reviewed with pt including prescriptions, medication samples and follow up appointment, compliance encouraged. All belongings from locker # 55 given to pt at time of departure. Scheduled medications given with verbal education and effects monitored. Safety checks maintained without incident till time of d/c.  R: Pt receptive to care. Compliant with medications when offered. Denies adverse drug reactions when assessed. Verbalized understanding related to d/c instructions. Signed belonging sheet in agreement with items received from locker. Ambulatory with a steady gait. Appears to be in no physical distress at time of departure.

## 2019-01-08 NOTE — Discharge Summary (Signed)
[facility] Psych Observation Discharge  01/08/2019 12:32 PM MARCKUS HANOVER  MRN:  [id number] Principal Problem: Alcohol-induced mood disorder [facility]) Discharge Diagnoses: Principal Problem:   Alcohol-induced mood disorder (HCC) Active Problems:   Generalized anxiety disorder   Severe recurrent major depression without psychotic features (HCC)   Subjective: Hunter Dawson, 23 y.o., male patient seen via tele psych by this provider, Dr. Lucianne Muss; and chart reviewed on 01/08/19.  On evaluation AMIAS HUTCHINSON reports he had an episode last night " I was at my girlfriend's house drinking, me my girlfriend got into an argument and I started crying I have put her again to my head.  Whenever I get drunk I get emotional and do stupid stuff I need to quit drinking.  I am not drinking as much as I used to I have gotten to the point where I may drink about once a month but I need to stop that."  Patient denies suicidal/self-harm/homicidal ideations, psychosis, and paranoia.  Patient denies prior suicide attempt.  States he has had 1 inpatient psychiatric admission in 2019 but it was more related to spike from his ex-girlfriend then it was anything.  Patient states that he has outpatient psychiatric services but he only has to see them about every few months.  Patient lives with his mother who is supportive.  And states that his girlfriend also told him that he cannot come to her house anymore while intoxicated.  Patient gave permission to speak to his girlfriend Ladona Ridgel and his mother Cala Bradford for collateral information. During evaluation ARMAAN POND is alert/oriented x 4; calm/cooperative; and mood is congruent with affect.  He does not appear to be responding to internal/external stimuli or delusional thoughts.  Patient denies suicidal/self-harm/homicidal ideation, psychosis, and paranoia.  Patient answered question appropriately.   Collateral Information: Mother Cala Bradford [phone]): Spoke with patient's  mother who collaborated patient story that patient is fine and is not harmful to himself but when he is intoxicated he does dumb things.  Patient's mother states that he is supposed to be taking medications but she do not think he is taking the like he supposed to.  States patient was doing really well and has stopped drinking alcohol and the only thing that she can think of that may have caused him to restart drinking was the stopping of his medication.  Reports she will check with them daily just to see if he had taken his medication and that is something that she is willing to do again just to let them know that she will be checking up on him again.  Mother states that she has no problem with patient coming back home and that she does feel that he is safe.  Girlfriend Ladona Ridgel [phone]): Spoke with patient's girlfriend who also stated the patient is very dependable and that he is not a threat to himself unless he has been drinking alcohol.  States that the patient does have a problem with depression and some anxiety and that he has been off his meds for about a week."  As long as he is not drinking he is not intoxicated he is fine."  Patient also reports that she does have a weapon that she keeps on her at all time related to having a concealed carry states that she would have a gun locked in a lock box when it is not on her person and that she has told the patient that he can no longer come to her home if he is  intoxicated or if he starts drinking.  Reports that she does feel the patient is safe to go home and that he is not a harm to himself or anyone else.   Total Time spent with patient: 1 hour  Past Psychiatric History: MDD, Alcohol abuse, Anxiety  Past Medical History:  Past Medical History:  Diagnosis Date  . ADHD (attention deficit hyperactivity disorder)    Treated with Adderall in middle school. None since that time. did not like the medicaiton  . Anxiety     Past Surgical History:   Procedure Laterality Date  . PERCUTANEOUS PINNING Right 06/13/2016   Procedure: CLOSED REDUCTION RIGHT HAND;  Surgeon: Knute NeuHarrill Coley, MD;  Location: [facility] OR;  Service: Plastics;  Laterality: Right;   Family History:  Family History  Problem Relation Age of Onset  . Arthritis Father   . Heart attack Father   . Carpal tunnel syndrome Father   . Cancer Paternal Grandfather    Family Psychiatric  History: unaware Social History:  Social History   Substance and Sexual Activity  Alcohol Use Yes     Social History   Substance and Sexual Activity  Drug Use Yes  . Types: Marijuana    Social History   Socioeconomic History  . Marital status: Single    Spouse name: Not on file  . Number of children: Not on file  . Years of education: Not on file  . Highest education level: Not on file  Occupational History  . Not on file  Social Needs  . Financial resource strain: Not on file  . Food insecurity    Worry: Not on file    Inability: Not on file  . Transportation needs    Medical: Not on file    Non-medical: Not on file  Tobacco Use  . Smoking status: Never Smoker  . Smokeless tobacco: Current User  . Tobacco comment: Dip  Substance and Sexual Activity  . Alcohol use: Yes  . Drug use: Yes    Types: Marijuana  . Sexual activity: Not Currently    Partners: Female  Lifestyle  . Physical activity    Days per week: Not on file    Minutes per session: Not on file  . Stress: Not on file  Relationships  . Social Musicianconnections    Talks on phone: Not on file    Gets together: Not on file    Attends religious service: Not on file    Active member of club or organization: Not on file    Attends meetings of clubs or organizations: Not on file    Relationship status: Not on file  Other Topics Concern  . Not on file  Social History Narrative  . Not on file    Has this patient used any form of tobacco in the last 30 days? (Cigarettes, Smokeless Tobacco, Cigars, and/or Pipes) A  prescription for an FDA-approved tobacco cessation medication was offered at discharge and the patient refused  Current Medications: Current Facility-Administered Medications  Medication Dose Route Frequency Provider Last Rate Last Dose  . acetaminophen (TYLENOL) tablet 650 mg  650 mg Oral Q6H PRN Nira ConnBerry, Jason A, NP      . alum & mag hydroxide-simeth (MAALOX/MYLANTA) 200-200-20 MG/5ML suspension 30 mL  30 mL Oral Q4H PRN Nira ConnBerry, Jason A, NP      . hydrOXYzine (ATARAX/VISTARIL) tablet 25 mg  25 mg Oral TID PRN Nira ConnBerry, Jason A, NP      . magnesium hydroxide (MILK OF MAGNESIA)  suspension 30 mL  30 mL Oral Daily PRN Jackelyn Poling, NP      . PARoxetine (PAXIL) tablet 40 mg  40 mg Oral Ruta Hinds A, NP   40 mg at 01/08/19 0838  . traZODone (DESYREL) tablet 50 mg  50 mg Oral QHS PRN Jackelyn Poling, NP       PTA Medications: Medications Prior to Admission  Medication Sig Dispense Refill Last Dose  . ALPRAZolam (XANAX) 1 MG tablet TAKE 1 TABLET(1 MG) BY MOUTH TWICE DAILY AS NEEDED FOR ANXIETY (Patient taking differently: Take 1 mg by mouth 2 (two) times daily as needed for anxiety. ) 15 tablet 0   . losartan (COZAAR) 50 MG tablet Take 1 tablet (50 mg total) by mouth daily. 30 tablet 0   . PARoxetine (PAXIL) 40 MG tablet Take 1 tablet (40 mg total) by mouth every morning. 90 tablet 1 01/07/2019  . hydrOXYzine (ATARAX/VISTARIL) 10 MG tablet Take 10 mg by mouth 3 (three) times daily.       Musculoskeletal: Strength & Muscle Tone: within normal limits Gait & Station: normal Patient leans: N/A  Psychiatric Specialty Exam: Physical Exam  Nursing note and vitals reviewed. Constitutional: He is oriented to person, place, and time. He appears well-developed and well-nourished. No distress.  Neck: Normal range of motion.  Respiratory: Effort normal.  Musculoskeletal: Normal range of motion.  Neurological: He is oriented to person, place, and time.  Skin: Skin is warm and dry.  Psychiatric: He  has a normal mood and affect. His speech is normal and behavior is normal. Judgment and thought content normal. Cognition and memory are normal.    Review of Systems  Psychiatric/Behavioral: Depression:  Stable. Hallucinations:  Denies. Substance abuse:  Alcohol.  Reports he has improved measuring about once a month but has plans to stop completely. Suicidal ideas:  Denies  Nervous/anxious:  Stable  Insomnia:  Denies.   All other systems reviewed and are negative.   Blood pressure 136/86, pulse 90, temperature 98.8 F (37.1 C), temperature source Oral, resp. rate 16, SpO2 98 %.There is no height or weight on file to calculate BMI.  General Appearance: Casual  Eye Contact:  Good  Speech:  Clear and Coherent and Normal Rate  Volume:  Normal  Mood:  "Good."  Appropriate  Affect:  Appropriate and Congruent  Thought Process:  Coherent, Goal Directed and Descriptions of Associations: Intact  Orientation:  Full (Time, Place, and Person)  Thought Content:  WDL and Logical  Suicidal Thoughts:  No  Homicidal Thoughts:  No  Memory:  Immediate;   Good Recent;   Good  Judgement:  Intact  Insight:  Good and Present  Psychomotor Activity:  Normal  Concentration:  Concentration: Good and Attention Span: Good  Recall:  Good  Fund of Knowledge:  Good  Language:  Good  Akathisia:  No  Handed:  Right  AIMS (if indicated):     Assets:  Communication Skills Desire for Improvement Housing Physical Health Social Support  ADL's:  Intact  Cognition:  WNL  Sleep:        Demographic Factors:  Male and Caucasian  Loss Factors: NA  Historical Factors: Impulsivity  Risk Reduction Factors:   Sense of responsibility to family, Religious beliefs about death, Living with another person, especially a relative, Positive social support and Positive therapeutic relationship  Continued Clinical Symptoms:  Alcohol/Substance Abuse/Dependencies Previous Psychiatric Diagnoses and Treatments  Cognitive  Features That Contribute To Risk:  None  Suicide Risk:  Minimal: No identifiable suicidal ideation.  Patients presenting with no risk factors but with morbid ruminations; may be classified as minimal risk based on the severity of the depressive symptoms    Plan Of Care/Follow-up recommendations:  Activity:  As tolerated Diet:  heart health Other:  Follow up with current outpatient psychiatric provider   Allergies as of 01/08/2019      Reactions   Lactose Intolerance (gi) Other (See Comments)   Stomach discomfort      Medication List    STOP taking these medications   ALPRAZolam 1 MG tablet Commonly known as: XANAX     TAKE these medications   hydrOXYzine 10 MG tablet Commonly known as: ATARAX/VISTARIL Take 10 mg by mouth 3 (three) times daily.   losartan 50 MG tablet Commonly known as: COZAAR Take 1 tablet (50 mg total) by mouth daily.   PARoxetine 40 MG tablet Commonly known as: PAXIL Take 1 tablet (40 mg total) by mouth every morning.       Disposition: No evidence of imminent risk to self or others at present.   Patient does not meet criteria for psychiatric inpatient admission. Supportive therapy provided about ongoing stressors. Discussed crisis plan, support from social network, calling 911, coming to the Emergency Department, and calling Suicide Hotline.     , NP 01/08/2019, 12:32 PM

## 2019-01-09 ENCOUNTER — Telehealth: Payer: Self-pay

## 2019-01-09 NOTE — Telephone Encounter (Signed)
Attempted to reach patient to schedule follow up appt with PCP. Unable to LM due to full mailbox.

## 2019-05-30 ENCOUNTER — Telehealth (INDEPENDENT_AMBULATORY_CARE_PROVIDER_SITE_OTHER): Payer: Managed Care, Other (non HMO) | Admitting: Physician Assistant

## 2019-05-30 ENCOUNTER — Other Ambulatory Visit: Payer: Self-pay

## 2019-05-30 ENCOUNTER — Encounter: Payer: Self-pay | Admitting: Physician Assistant

## 2019-05-30 DIAGNOSIS — F988 Other specified behavioral and emotional disorders with onset usually occurring in childhood and adolescence: Secondary | ICD-10-CM

## 2019-05-30 NOTE — Progress Notes (Signed)
I have discussed the procedure for the virtual visit with the patient who has given consent to proceed with assessment and treatment.   Leshawn Straka S Kiyan Burmester, CMA     

## 2019-05-30 NOTE — Progress Notes (Signed)
   Virtual Visit via Video   I connected with patient on 05/30/19 at 10:00 AM EDT by a video enabled telemedicine application and verified that I am speaking with the correct person using two identifiers.  Location patient: Home Location provider: [place] [facility], Office Persons participating in the virtual visit: Patient, Provider, CMA (Patina Moore)  I discussed the limitations of evaluation and management by telemedicine and the availability of in person appointments. The patient expressed understanding and agreed to proceed.  Subjective:   HPI:    Patient presents via Careg[facility] to discuss ADD symptoms.  Patient endorses longstanding history of ADD, initially diagnosed when he was in elementary school.  Patient states he was tried on multiple medications both stimulant and nonstimulant, finally being placed on Adderall with good results and tolerability.  Patient states he took this from elementary school throughout high school.  After high school he stopped the medication as he did not need since he was no longer in school.  Has been able to compensate to maintain focus at work.  The past few months he recently started a program through [employer].  Patient states as coursework is increased he is having more difficulty focusing.  Would like to discuss the potential of restarting a medication for ADD..  ROS:   See pertinent positives and negatives per HPI.  Patient Active Problem List   Diagnosis Date Noted  . Severe recurrent major depression without psychotic features (HCC) 01/08/2019  . Alcohol-induced mood disorder (HCC) 01/08/2019  . Need for Tdap vaccination 04/17/2018  . Alcohol abuse 03/29/2018  . Hypertension 03/29/2018  . Visit for preventive health examination 08/17/2017  . ADD (attention deficit disorder) 02/18/2013  . Generalized anxiety disorder 02/18/2013    Social History   Tobacco Use  . Smoking status: Never Smoker  . Smokeless  tobacco: Current User  . Tobacco comment: Dip  Substance Use Topics  . Alcohol use: Yes    Current Outpatient Medications:  .  PARoxetine (PAXIL) 40 MG tablet, Take 1 tablet (40 mg total) by mouth every morning., Disp: 90 tablet, Rfl: 1  Allergies  Allergen Reactions  . Lactose Intolerance (Gi) Other (See Comments)    Stomach discomfort    Objective:   There were no vitals taken for this visit.  Patient is well-developed, well-nourished in no acute distress.  Resting comfortably at home.  Head is normocephalic, atraumatic.  No labored breathing.  Speech is clear and coherent with logical content.  Patient is alert and oriented at baseline.   Assessment and Plan:   1. Attention deficit disorder (ADD) without hyperactivity Tried on multiple medications previously.  Did the best on Adderall per patient report.  Discussed with him that I am not opposed to restarting him on medication.  Typically would start with a nonstimulant, especially given his history of alcohol abuse.  Patient again endorses he been on these in the past and did not have good response to them.  Endorses he has been completely sober for the past 3 months.  States he is working hard to get his life back together and has no intention of reusing.  Is taking his paroxetine as directed for anxiety with significant improvement in symptoms.  Discussed with him I am willing to consider placing him on a low-dose of Adderall XR for ADD pending he has a negative UDS and signed controlled substance contract.  Patient has been scheduled for this.    Piedad Climes, PA-C 05/30/2019

## 2019-05-31 ENCOUNTER — Ambulatory Visit: Payer: Managed Care, Other (non HMO) | Admitting: Physician Assistant

## 2019-06-04 ENCOUNTER — Telehealth: Payer: Self-pay

## 2019-06-04 ENCOUNTER — Other Ambulatory Visit: Payer: Self-pay

## 2019-06-04 ENCOUNTER — Ambulatory Visit: Payer: Managed Care, Other (non HMO) | Admitting: Physician Assistant

## 2019-06-04 DIAGNOSIS — Z79899 Other long term (current) drug therapy: Secondary | ICD-10-CM

## 2019-06-04 NOTE — Telephone Encounter (Signed)
He will have to at least drop by office to leave a urine specimen for UDS as discussed. This has to be clear (we did discuss prior use of THC but should be clean of other substances) before starting medication.

## 2019-06-04 NOTE — Telephone Encounter (Signed)
Called patient but unable to leave a message. Mail box was full.
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
## 2019-06-04 NOTE — Telephone Encounter (Signed)
Called patient to inform but voice mail was not set up.

## 2019-06-04 NOTE — Telephone Encounter (Signed)
FYI: Patient scheduled for urine drug screen Wednesday at 9am.

## 2019-06-05 ENCOUNTER — Other Ambulatory Visit: Payer: Self-pay

## 2019-06-05 ENCOUNTER — Ambulatory Visit: Payer: Managed Care, Other (non HMO)

## 2019-06-05 ENCOUNTER — Ambulatory Visit: Payer: Managed Care, Other (non HMO) | Admitting: Physician Assistant

## 2019-06-05 ENCOUNTER — Ambulatory Visit (INDEPENDENT_AMBULATORY_CARE_PROVIDER_SITE_OTHER): Payer: Managed Care, Other (non HMO)

## 2019-06-05 DIAGNOSIS — Z79899 Other long term (current) drug therapy: Secondary | ICD-10-CM | POA: Diagnosis not present

## 2019-06-06 ENCOUNTER — Ambulatory Visit: Payer: Managed Care, Other (non HMO)

## 2019-06-08 ENCOUNTER — Other Ambulatory Visit: Payer: Self-pay | Admitting: Physician Assistant

## 2019-06-08 LAB — PAIN MGMT, PROFILE 8 W/CONF, U
6 Acetylmorphine: NEGATIVE ng/mL
Alcohol Metabolites: NEGATIVE ng/mL (ref <500)
Amphetamines: NEGATIVE ng/mL
Benzodiazepines: NEGATIVE ng/mL
Buprenorphine, Urine: NEGATIVE ng/mL
Buprenorphine: NEGATIVE ng/mL
Cocaine Metabolite: NEGATIVE ng/mL
Creatinine: 223.5 mg/dL
MDMA: NEGATIVE ng/mL
Marijuana Metabolite: 3028 ng/mL
Marijuana Metabolite: POSITIVE ng/mL
Norbuprenorphine: NEGATIVE ng/mL
Opiates: NEGATIVE ng/mL
Oxidant: NEGATIVE ug/mL
Oxycodone: NEGATIVE ng/mL
pH: 5.6 (ref 4.5–9.0)

## 2019-06-08 MED ORDER — AMPHETAMINE-DEXTROAMPHET ER 15 MG PO CP24: 15.0000 mg | ORAL_CAPSULE | ORAL | 0 refills | Status: AC

## 2019-06-19 ENCOUNTER — Other Ambulatory Visit: Payer: Self-pay | Admitting: Physician Assistant

## 2019-06-19 DIAGNOSIS — F411 Generalized anxiety disorder: Secondary | ICD-10-CM

## 2019-06-25 ENCOUNTER — Encounter: Payer: Self-pay | Admitting: Physician Assistant

## 2019-06-28 ENCOUNTER — Encounter: Payer: Managed Care, Other (non HMO) | Admitting: Physician Assistant

## 2019-06-28 ENCOUNTER — Ambulatory Visit: Payer: Managed Care, Other (non HMO) | Admitting: Physician Assistant

## 2019-06-28 DIAGNOSIS — Z0289 Encounter for other administrative examinations: Secondary | ICD-10-CM

## 2019-07-02 ENCOUNTER — Telehealth: Payer: Managed Care, Other (non HMO) | Admitting: Physician Assistant

## 2019-07-24 ENCOUNTER — Encounter: Payer: Self-pay | Admitting: Physician Assistant

## 2019-07-24 NOTE — Telephone Encounter (Signed)
Needs appointment to discuss in further detail so we can determine next appropriate steps.  Patient canceled/no showed for his last 2 appointments

## 2019-11-01 IMAGING — DX DG CHEST 2V
2 series · 2 of 2 positions shown · non-contrast
Comparison: None.

CLINICAL DATA: Left arm numbness.  The patient vomited today.

EXAM:
CHEST - 2 VIEW

[chest pa]
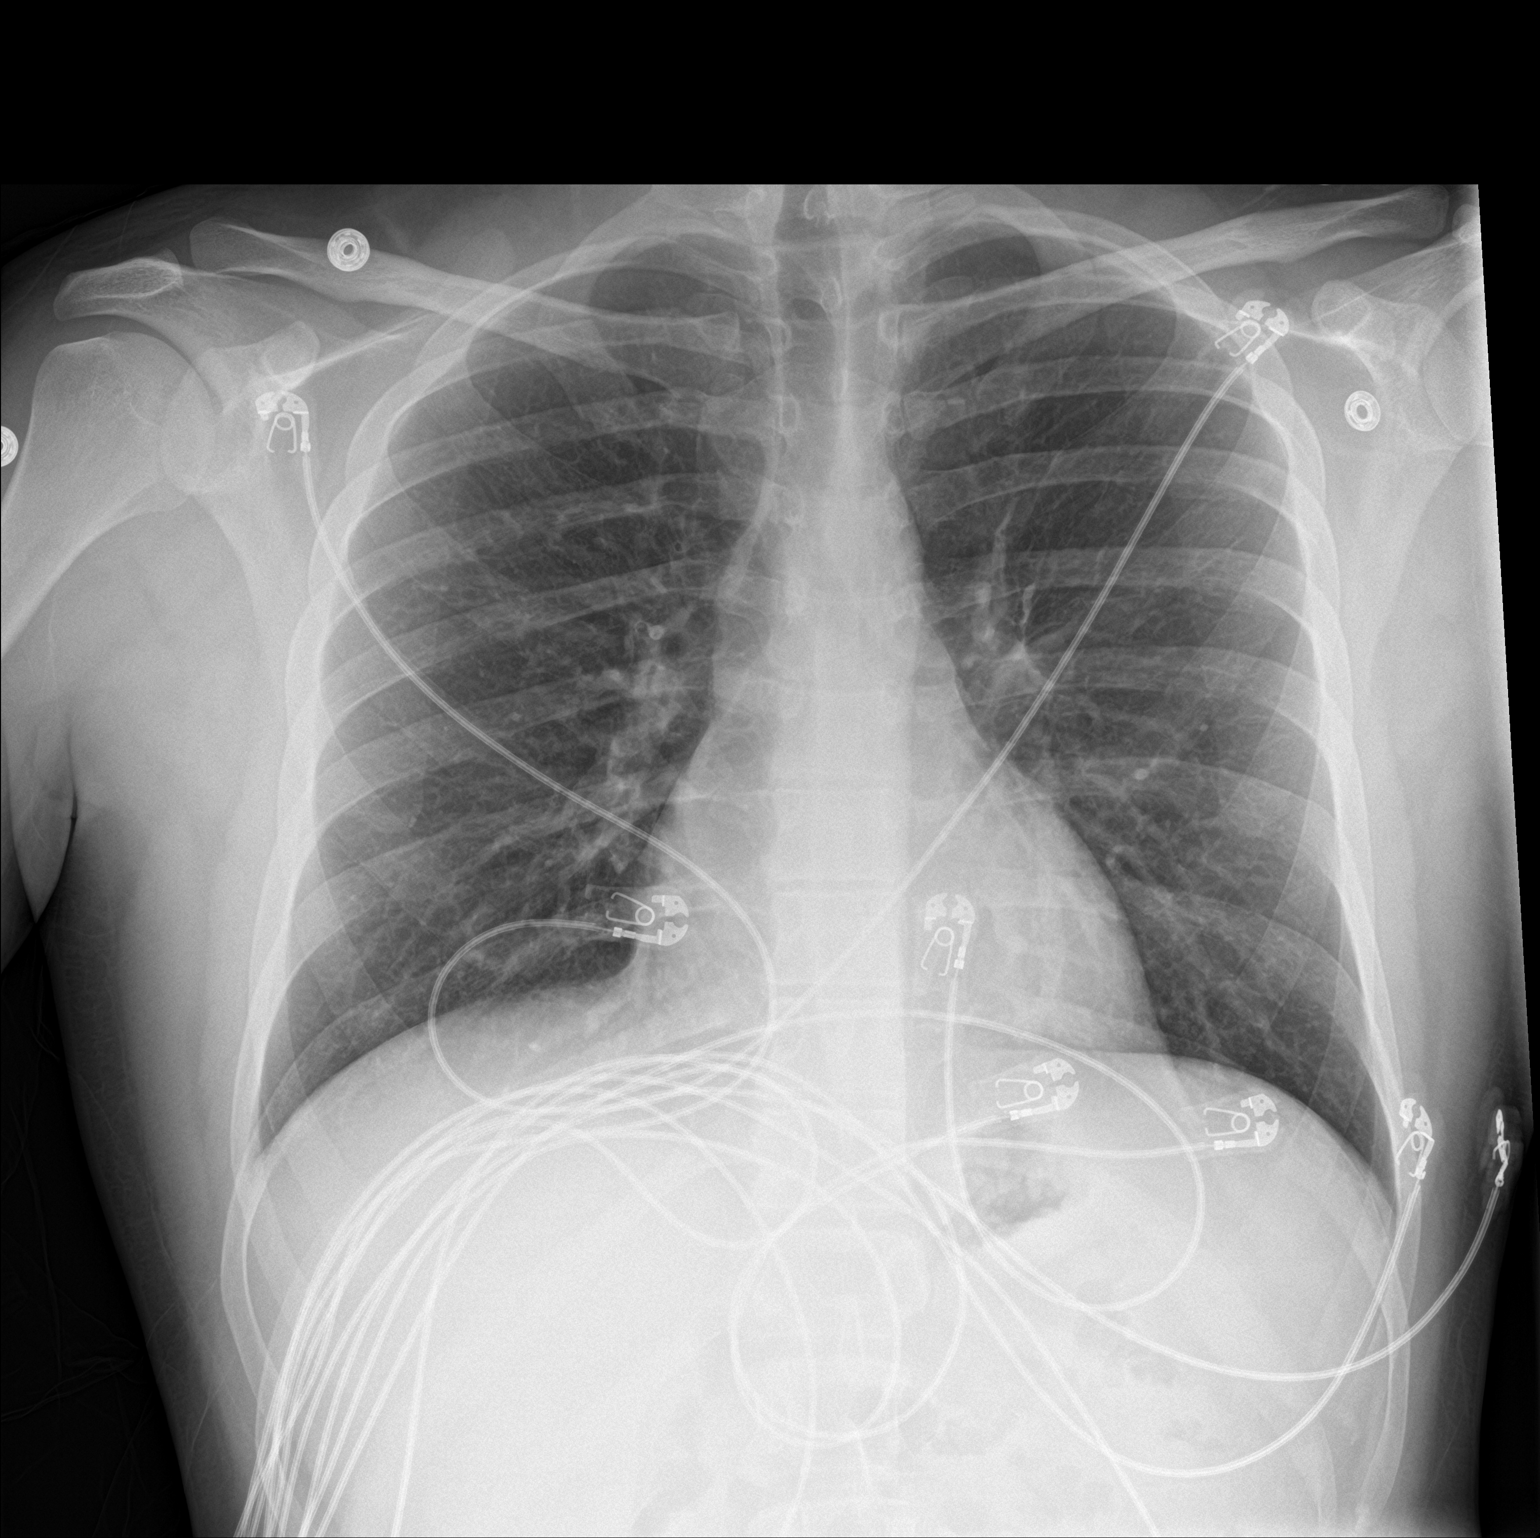

[chest lat]
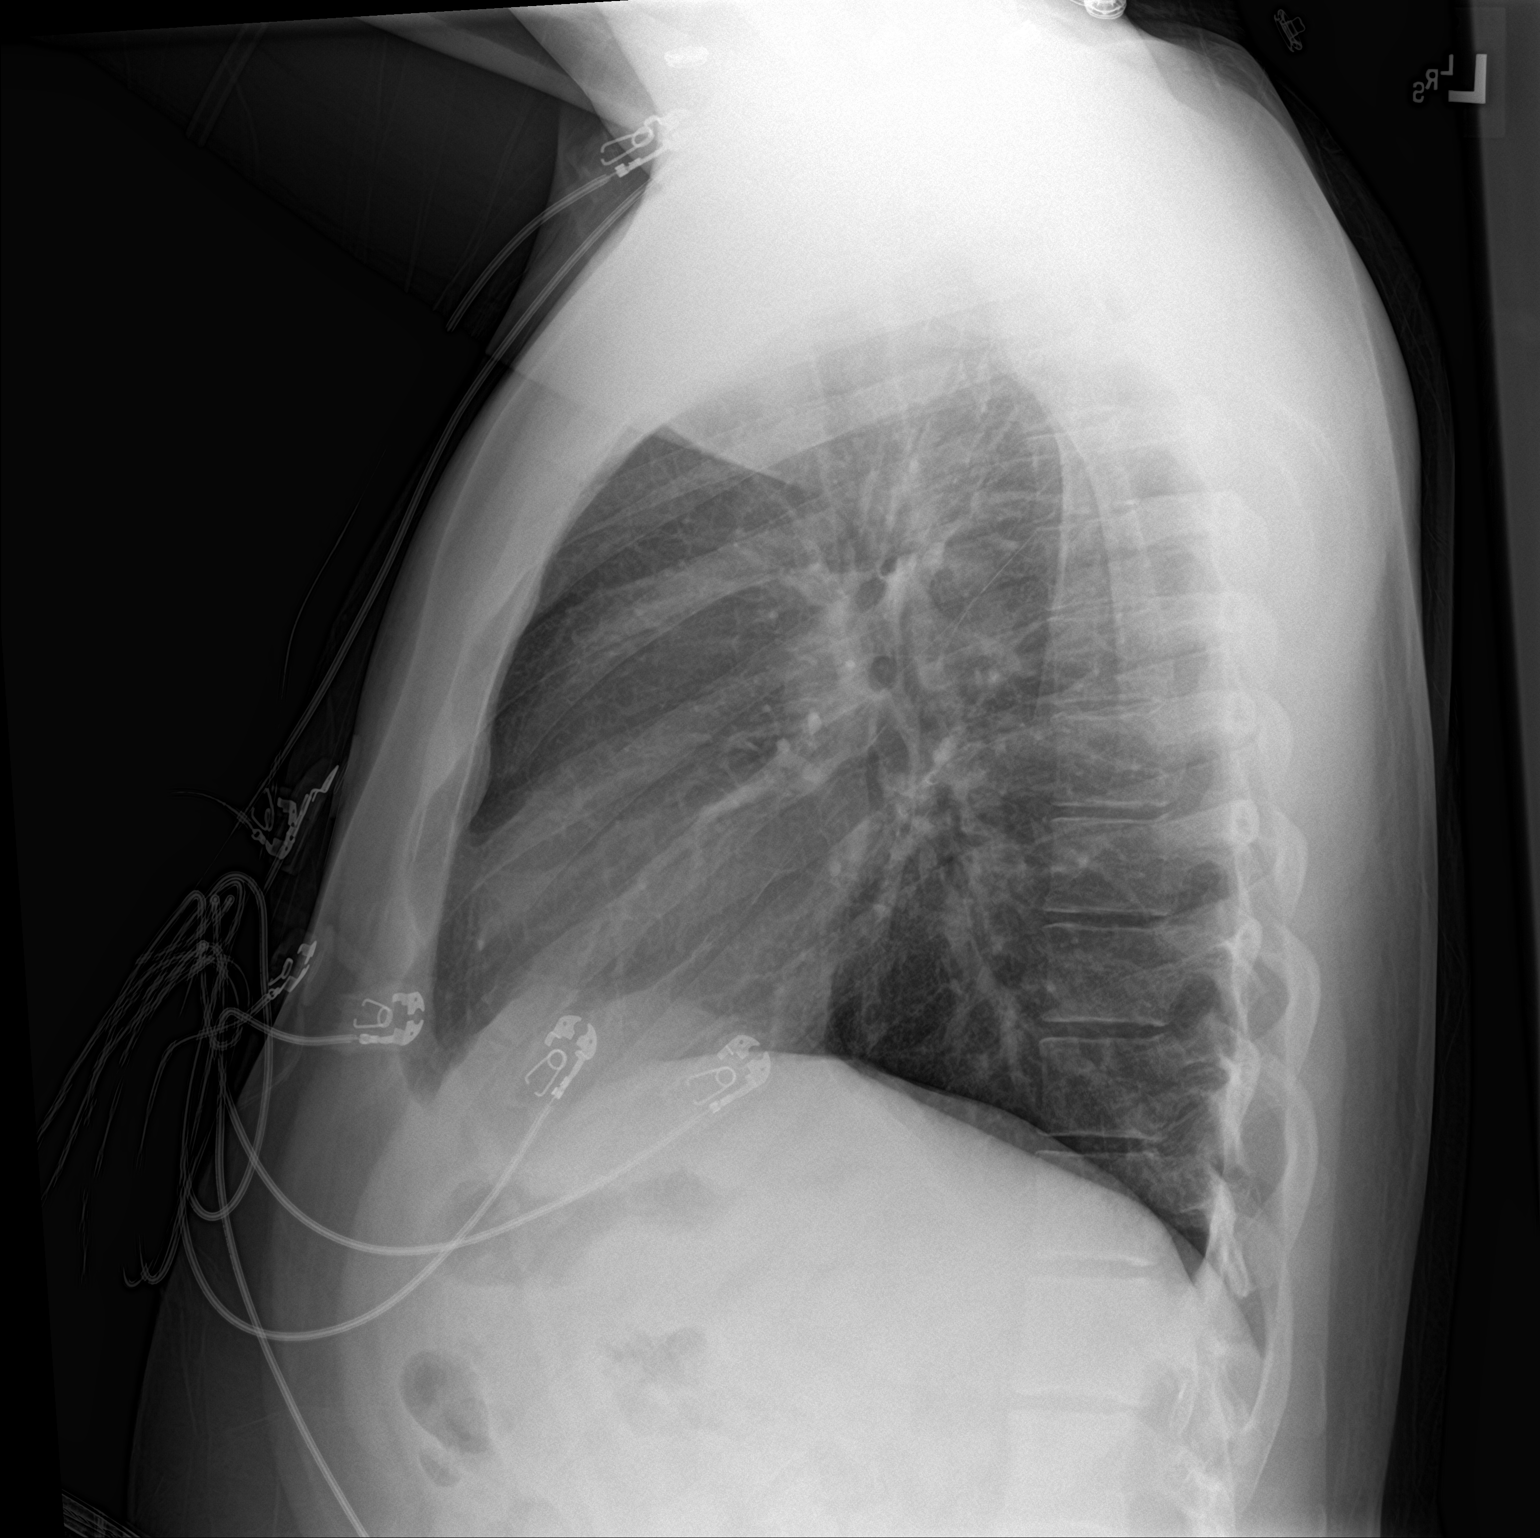

[2 of 2 positions shown; findings below may reference images not displayed]

FINDINGS: Lungs are clear. Heart size is normal. No pneumothorax or pleural
effusion. No acute or focal bony abnormality.
IMPRESSION: Negative chest.

## 2020-06-06 IMAGING — DX CHEST - 2 VIEW
2 series · 2 of 2 positions shown · non-contrast
Comparison: March 20, 2018

CLINICAL DATA: Chest pain

EXAM:
CHEST - 2 VIEW

[chest pa]
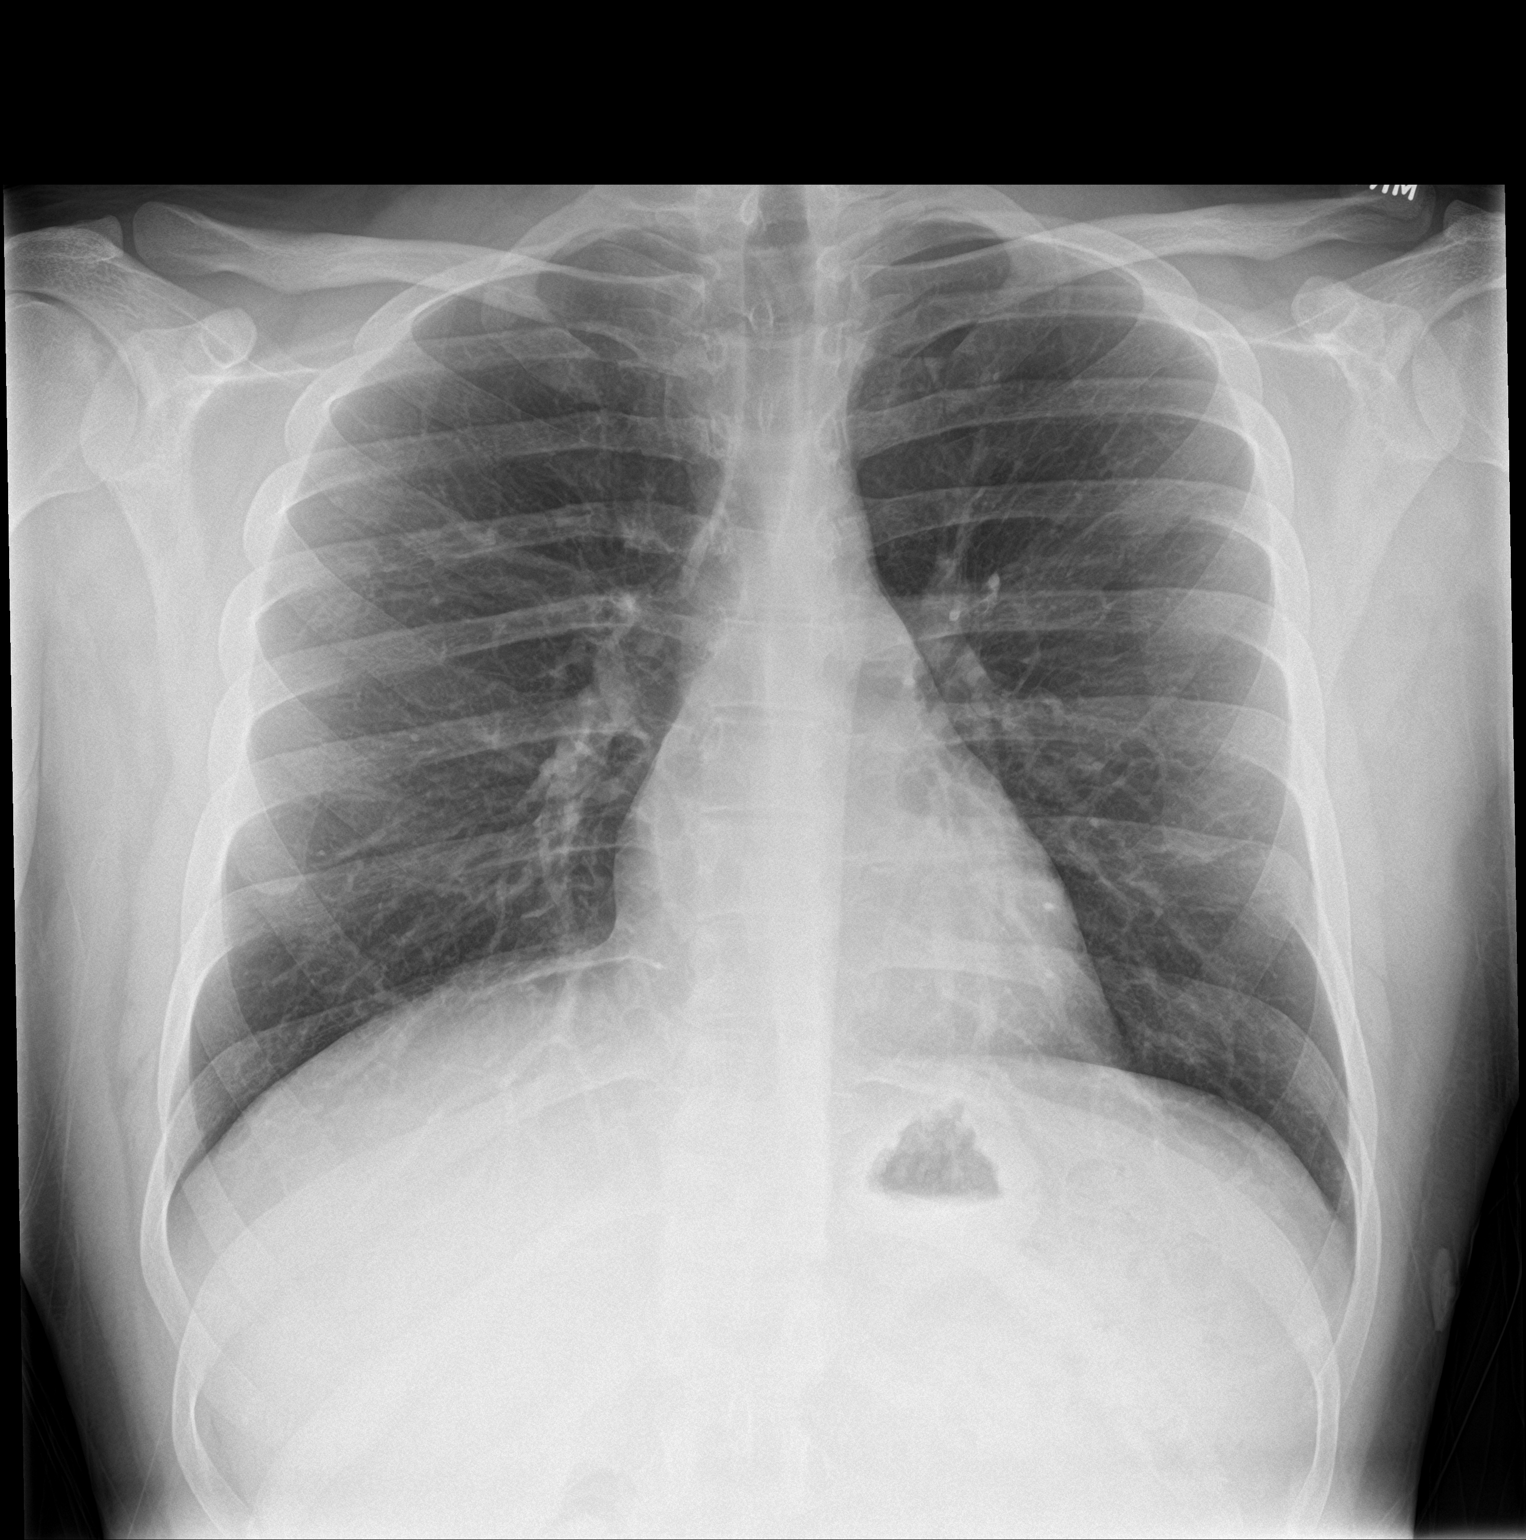

[chest lat]
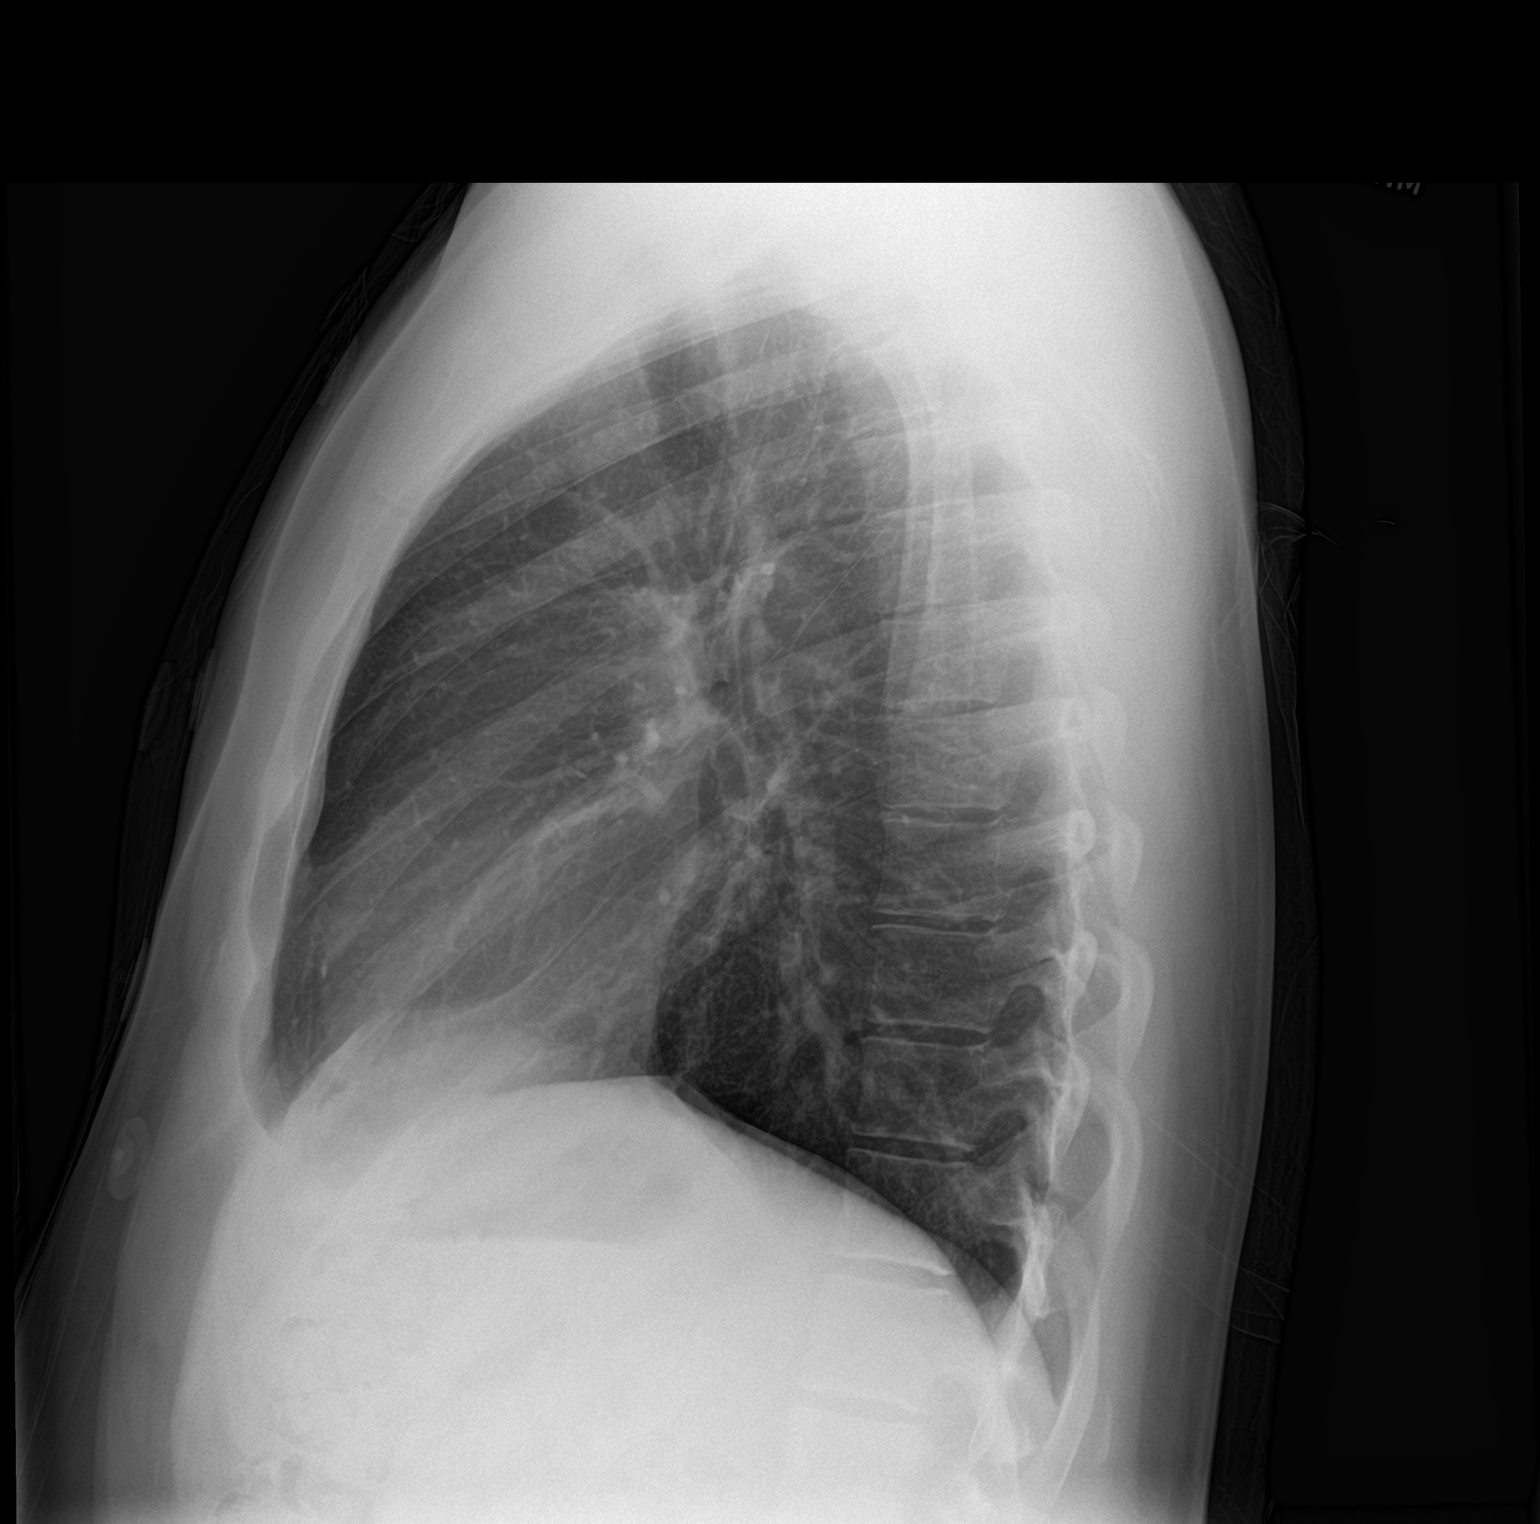

[2 of 2 positions shown; findings below may reference images not displayed]

FINDINGS: Lungs are clear. Heart size and pulmonary vascularity are normal. No
adenopathy. No pneumothorax. No bone lesions.
IMPRESSION: No edema or consolidation.

## 2020-08-22 ENCOUNTER — Ambulatory Visit: Payer: Managed Care, Other (non HMO) | Attending: Obstetrics and Gynecology

## 2020-08-22 DIAGNOSIS — O361190 Maternal care for Anti-A sensitization, unspecified trimester, not applicable or unspecified: Secondary | ICD-10-CM

## 2020-08-26 ENCOUNTER — Telehealth: Payer: Self-pay | Admitting: Obstetrics and Gynecology

## 2020-08-26 LAB — K (KELL) RBC ANTIGEN TYPING: K (Kell) RBC Antigen Typing: NEGATIVE

## 2020-08-26 NOTE — Telephone Encounter (Signed)
I called Hunter Dawson and informed him of Kell antigen results. Kell antigen is NEGATIVE.  I reassured him that his wife who has anti-Kell antibodies is likely to carry a Kell-Negative fetus and is unlikely to have fetal hemolysis. Hunter Dawson, his wife was present when I called him.

## 2020-11-14 ENCOUNTER — Ambulatory Visit (HOSPITAL_COMMUNITY)
Admission: RE | Admit: 2020-11-14 | Discharge: 2020-11-14 | Disposition: A | Discharge status: Home / Self Care | Payer: 59 | Attending: Psychiatry | Admitting: Psychiatry

## 2020-11-14 ENCOUNTER — Encounter (HOSPITAL_COMMUNITY): Payer: Self-pay | Admitting: Emergency Medicine

## 2020-11-14 ENCOUNTER — Emergency Department (HOSPITAL_COMMUNITY)
Admission: EM | Admit: 2020-11-14 | Discharge: 2020-11-15 | Disposition: A | Discharge status: Home / Self Care | Payer: Managed Care, Other (non HMO) | Attending: Emergency Medicine | Admitting: Emergency Medicine

## 2020-11-14 DIAGNOSIS — F1024 Alcohol dependence with alcohol-induced mood disorder: Secondary | ICD-10-CM | POA: Diagnosis not present

## 2020-11-14 DIAGNOSIS — F17200 Nicotine dependence, unspecified, uncomplicated: Secondary | ICD-10-CM | POA: Insufficient documentation

## 2020-11-14 DIAGNOSIS — F1010 Alcohol abuse, uncomplicated: Secondary | ICD-10-CM | POA: Diagnosis present

## 2020-11-14 DIAGNOSIS — I10 Essential (primary) hypertension: Secondary | ICD-10-CM | POA: Insufficient documentation

## 2020-11-14 DIAGNOSIS — F1094 Alcohol use, unspecified with alcohol-induced mood disorder: Secondary | ICD-10-CM | POA: Diagnosis present

## 2020-11-14 DIAGNOSIS — Z20822 Contact with and (suspected) exposure to covid-19: Secondary | ICD-10-CM | POA: Insufficient documentation

## 2020-11-14 DIAGNOSIS — Y908 Blood alcohol level of 240 mg/100 ml or more: Secondary | ICD-10-CM | POA: Insufficient documentation

## 2020-11-14 DIAGNOSIS — Z79899 Other long term (current) drug therapy: Secondary | ICD-10-CM | POA: Diagnosis not present

## 2020-11-14 DIAGNOSIS — F10920 Alcohol use, unspecified with intoxication, uncomplicated: Secondary | ICD-10-CM

## 2020-11-14 LAB — COMPREHENSIVE METABOLIC PANEL
ALT: 8 U/L (ref 0–44)
AST: 18 U/L (ref 15–41)
Albumin: 4.3 g/dL (ref 3.5–5.0)
Alkaline Phosphatase: 41 U/L (ref 38–126)
Anion gap: 9 (ref 5–15)
BUN: 11 mg/dL (ref 6–20)
CO2: 29 mmol/L (ref 22–32)
Calcium: 9.3 mg/dL (ref 8.9–10.3)
Chloride: 110 mmol/L (ref 98–111)
Creatinine, Ser: 0.69 mg/dL (ref 0.61–1.24)
GFR, Estimated: >60 mL/min (ref >60)
Glucose, Bld: 107 mg/dL — ABNORMAL HIGH (ref 70–99)
Potassium: 3.9 mmol/L (ref 3.5–5.1)
Sodium: 148 mmol/L — ABNORMAL HIGH (ref 135–145)
Total Bilirubin: 0.3 mg/dL (ref 0.3–1.2)
Total Protein: 7.5 g/dL (ref 6.5–8.1)

## 2020-11-14 LAB — RAPID URINE DRUG SCREEN, HOSP PERFORMED
Amphetamines: NOT DETECTED
Barbiturates: NOT DETECTED
Benzodiazepines: NOT DETECTED
Cocaine: NOT DETECTED
Opiates: NOT DETECTED
Tetrahydrocannabinol: NOT DETECTED

## 2020-11-14 LAB — CBC
HCT: 43.3 % (ref 39.0–52.0)
Hemoglobin: 15.2 g/dL (ref 13.0–17.0)
MCH: 31.2 pg (ref 26.0–34.0)
MCHC: 35.1 g/dL (ref 30.0–36.0)
MCV: 88.9 fL (ref 80.0–100.0)
Platelets: 379 10*3/uL (ref 150–400)
RBC: 4.87 MIL/uL (ref 4.22–5.81)
RDW: 12.3 % (ref 11.5–15.5)
WBC: 5.7 10*3/uL (ref 4.0–10.5)
nRBC: 0 % (ref 0.0–0.2)

## 2020-11-14 LAB — SALICYLATE LEVEL: Salicylate Lvl: <7 mg/dL — ABNORMAL LOW (ref 7.0–30.0)

## 2020-11-14 LAB — ETHANOL: Alcohol, Ethyl (B): 269 mg/dL — ABNORMAL HIGH (ref <10)

## 2020-11-14 LAB — ACETAMINOPHEN LEVEL: Acetaminophen (Tylenol), Serum: <10 ug/mL — ABNORMAL LOW (ref 10–30)

## 2020-11-14 NOTE — ED Notes (Signed)
Pt provided w/labeled specimen cup for urine collection per MD order. ENMiles 

## 2020-11-14 NOTE — ED Notes (Signed)
Patient given sprite.

## 2020-11-14 NOTE — H&P (Signed)
Behavioral Health Medical Screening Exam  Hunter Dawson is an 25 y.o. male who presented to [facility] voluntarily with his mother for evaluation of alcohol abuse and depression with some suicidal thoughts. Patient is actively intoxicated and unable to participate in majority of the assessment. Patient speech slurred, skin flushed. Tachycardic. Unstable gait. Crying intermittently and spontaneously. Mother states patient has been drinking "nonstop" over past few days and called to today stating he was "ready for help". Recent breakup with current pregnant girlfriend who is due in October; mother notes decline since. States patient has made some suicidal statements; currently seeking detox from alcohol and help with depression.    Patient transported to [facility] for medical clearance; EDP [facility] called and notified.   Total Time spent with patient: 20 minutes  Psychiatric Specialty Exam: Physical Exam Vitals and nursing note reviewed.  Constitutional:      Appearance: He is normal weight. He is toxic-appearing and diaphoretic.  HENT:     Head: Normocephalic.     Nose: Nose normal.  Cardiovascular:     Rate and Rhythm: Tachycardia present.     Heart sounds: Normal heart sounds.  Pulmonary:     Effort: Pulmonary effort is normal.  Musculoskeletal:        General: Normal range of motion.     Cervical back: Normal range of motion.  Skin:    General: Skin is warm.  Neurological:     Mental Status: He is disoriented.     Coordination: Coordination abnormal.     Gait: Gait abnormal.   Review of Systems  Constitutional:  Positive for activity change and diaphoresis.  Neurological:  Positive for speech difficulty.  Psychiatric/Behavioral:  Positive for behavioral problems, decreased concentration and suicidal ideas.        Actively intoxicated  Temperature (P) 98.2 F (36.8 C), temperature source (P) Oral.There is no height or weight on file to calculate BMI. General Appearance: Disheveled Eye  Contact:  Minimal Speech:  Garbled and Slurred Volume:  Increased Mood:  Irritable Affect:  Inappropriate, Labile, and Tearful Thought Process:  Goal Directed Orientation:  Negative Thought Content:  Tangential and unable to fully assess due to current intoxication Suicidal Thoughts:  unable to fully assess due to current intoxication Homicidal Thoughts:  unable to fully assess due to current intoxication Memory:  unable to fully assess due to current intoxication Judgement:  Impaired Insight:  unable to fully assess due to current intoxication Psychomotor Activity:  Restlessness and Wide Base Concentration: Concentration: Poor and Attention Span: Poor Recall:  Poor Fund of Knowledge:unable to fully assess due to current intoxication Language: Poor Akathisia:  unable to fully assess due to current intoxication Handed:  unable to fully assess due to current intoxication AIMS (if indicated):    Assets:  Physical Health Resilience Social Support Vocational/Educational Sleep:     Musculoskeletal: Strength & Muscle Tone: abnormal Gait & Station: unsteady, broad based Patient leans: Front and Backward  Temperature (P) 98.2 F (36.8 C), temperature source (P) Oral.  Recommendations: Based on my evaluation the patient appears to have an emergency medical condition for which I recommend the patient be transferred to the emergency department for further evaluation.  Loletta Parish, NP 11/14/2020, 6:53 PM

## 2020-11-14 NOTE — ED Notes (Signed)
PT wanded by security ?

## 2020-11-14 NOTE — ED Triage Notes (Addendum)
Per EMS, patient from [facility], walk in for SI. Admits to drinking alcohol today. Tachycardic with EMS. A&Ox4. Denies SI during triage.

## 2020-11-14 NOTE — ED Notes (Signed)
Patient changed into paper scrubs. Called security to wand patient.

## 2020-11-14 NOTE — ED Notes (Signed)
Patient given sandwich and cheese.

## 2020-11-14 NOTE — ED Notes (Signed)
One labeled patient belongings bag secured at nurse's station.  

## 2020-11-15 ENCOUNTER — Inpatient Hospital Stay (HOSPITAL_COMMUNITY): Admission: RE | Admit: 2020-11-15 | Payer: 59 | Source: Home / Self Care | Admitting: Emergency Medicine

## 2020-11-15 DIAGNOSIS — F1094 Alcohol use, unspecified with alcohol-induced mood disorder: Secondary | ICD-10-CM

## 2020-11-15 LAB — RESP PANEL BY RT-PCR (FLU A&B, COVID) ARPGX2
Influenza A by PCR: NEGATIVE
Influenza B by PCR: NEGATIVE
SARS Coronavirus 2 by RT PCR: NEGATIVE

## 2020-11-15 MED ORDER — LORAZEPAM 2 MG/ML IJ SOLN: 0.0000 mg | Freq: Four times a day (QID) | INTRAMUSCULAR | Status: DC

## 2020-11-15 MED ORDER — LORAZEPAM 1 MG PO TABS: 0.0000 mg | ORAL_TABLET | Freq: Four times a day (QID) | ORAL | Status: DC

## 2020-11-15 MED ORDER — ONDANSETRON HCL 4 MG PO TABS: 4.0000 mg | ORAL_TABLET | Freq: Three times a day (TID) | ORAL | Status: DC | PRN

## 2020-11-15 MED ORDER — LORAZEPAM 1 MG PO TABS: 0.0000 mg | ORAL_TABLET | Freq: Two times a day (BID) | ORAL | Status: DC

## 2020-11-15 MED ORDER — THIAMINE HCL 100 MG/ML IJ SOLN: 100.0000 mg | Freq: Every day | INTRAMUSCULAR | Status: DC

## 2020-11-15 MED ORDER — ALUM & MAG HYDROXIDE-SIMETH 200-200-20 MG/5ML PO SUSP: 30.0000 mL | Freq: Four times a day (QID) | ORAL | Status: DC | PRN

## 2020-11-15 MED ORDER — THIAMINE HCL 100 MG PO TABS
100.0000 mg | ORAL_TABLET | Freq: Every day | ORAL | Status: DC
Start: 2020-11-15 — End: 2020-11-15
  Administered 2020-11-15: 100 mg via ORAL
  Filled 2020-11-15: qty 1

## 2020-11-15 MED ORDER — LORAZEPAM 2 MG/ML IJ SOLN: 0.0000 mg | Freq: Two times a day (BID) | INTRAMUSCULAR | Status: DC

## 2020-11-15 NOTE — ED Provider Notes (Signed)
Patient seen by psychiatry and cleared for discharge   Lorre Nick, MD 11/15/20 1400
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
## 2020-11-15 NOTE — Progress Notes (Signed)
CSW provided the following resources for the patient.  [place] [facility]-will provide timely access to mental health services for children and adolescents (4-17) and adults presenting in a mental health crisis. The program is designed for those who need urgent Behavioral Health or Substance Use treatment and are not experiencing a medical crisis that would typically require an emergency room visit.    [address] [place], [place] [address] Phone: [phone] [email or web address]   [facility] will also offer the following outpatient services: (Monday through Friday 8am-5pm)   Partial Hospitalization Program (PHP) Substance Abuse Intensive Outpatient Program (SA-IOP) Group Therapy Medication Management Peer Living Room   We also provide (24/7):    Assessments: Our mental health clinician and providers will conduct a focused mental health evaluation, assessing for immediate safety concerns and further mental health needs.   Referral: Our team will provide resources and help connect to community based mental health treatment, when indicated, including psychotherapy, psychiatry, and other specialized behavioral health or substance use disorder services (for those not already in treatment).   Transitional Care: Our team providers in person bridging and/or telphonic follow-up during the patient's transition to outpatient services.       Crissie Reese, MSW, LCSW-A, LCAS-A Phone: [phone] Disposition/TOC

## 2020-11-15 NOTE — Consult Note (Signed)
[facility] Discharge  11/15/2020 1:47 PM ZUBAIR LOFTON  MRN:  [id number]  Method of visit?: Face to Face   Principal Problem: Alcohol-induced mood disorder [facility]) Discharge Diagnoses: Principal Problem:   Alcohol-induced mood disorder (HCC) Active Problems:   Alcohol abuse   Subjective:  TALEN POSER is an 25 y.o. male who intially presented to [facility] voluntarily with his mother for evaluation of alcohol abuse and depression with some suicidal thoughts. Patient was actively intoxicated and unable to participate in majority of the assessment. Patient was noted to have slurred speech, skin flushed, tachycardic, unstable gait with crying intermittently and spontaneously. Patient's mother stated patient had been drinking "nonstop" over past few days and called to today stating he was "ready for help". Recent breakup with current pregnant girlfriend who is due in October; mother notes decline since. States patient has made some suicidal statements; currently seeking detox from alcohol and help with depression.   On assessment provider discussed patient's acceptance to [facility]; patient declines admission at this time stating "I have things to do". Patient denies any suicidal or homicidal ideations, auditory or visual hallucinations, and does not appear to be responding to any external/internal stimuli. Patient is alert and oriented; speech is clear, appears concrete. Gave verbal permission for provider to speak to mother.   Collateral: Raahil Ong (mother) [phone] Denies any safety concerns. States she is disappointed that patient is deciding to not seek treatment and is on her way to get patient for discharge.   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 None since that time. did not like the medicaiton   Anxiety     Past Surgical History:  Procedure Laterality Date   PERCUTANEOUS PINNING Right 06/13/2016   Procedure: CLOSED REDUCTION RIGHT HAND;  Surgeon: Knute Neu, MD;  Location: [facility] OR;  Service: Plastics;  Laterality: Right;   Family History:  Family History  Problem Relation Age of Onset   Arthritis Father    Heart attack Father    Carpal tunnel syndrome Father    Cancer Paternal Grandfather    Family Psychiatric  History: not noted Social History:  Social History   Substance and Sexual Activity  Alcohol Use Yes     Social History   Substance and Sexual Activity  Drug Use Yes   Types: Marijuana    Social History   Socioeconomic History   Marital status: Single    Spouse name: Not on file   Number of children: Not on file   Years of education: Not on file   Highest education level: Not on file  Occupational History   Not on file  Tobacco Use   Smoking status: Never   Smokeless tobacco: Current   Tobacco comments:    Dip  Vaping Use   Vaping Use: Former  Substance and Sexual Activity   Alcohol use: Yes   Drug use: Yes    Types: Marijuana   Sexual activity: Not Currently    Partners: Female  Other Topics Concern   Not on file  Social History Narrative   Not on file   Social Determinants of Health   Financial Resource Strain: Not on file  Food Insecurity: Not on file  Transportation Needs: Not on file  Physical Activity: Not on file  Stress: Not on file  Social Connections: Not on file    Tobacco Cessation:  N/A, patient does not currently use tobacco products  Current Medications: Current Facility-Administered Medications  Medication Dose Route Frequency Provider Last Rate Last Admin   alum & mag hydroxide-simeth (MAALOX/MYLANTA) 200-200-20 MG/5ML suspension 30 mL  30 mL Oral Q6H PRN McDonald, Mia A,  PA-C       LORazepam (ATIVAN) injection 0-4 mg  0-4 mg Intravenous Q6H McDonald, Mia A, PA-C       Or   LORazepam (ATIVAN) tablet 0-4 mg  0-4 mg Oral Q6H McDonald, Mia A, PA-C       [START ON 11/17/2020] LORazepam (ATIVAN) injection 0-4 mg  0-4 mg Intravenous Q12H McDonald, Mia A, PA-C       Or   [START ON 11/17/2020] LORazepam (ATIVAN) tablet 0-4 mg  0-4 mg Oral Q12H McDonald, Mia A, PA-C       ondansetron (ZOFRAN) tablet 4 mg  4 mg Oral Q8H PRN McDonald, Mia A, PA-C       thiamine tablet 100 mg  100 mg Oral Daily McDonald, Mia A, PA-C   100 mg at 11/15/20 1132   Or   thiamine (B-1) injection 100 mg  100 mg Intravenous Daily McDonald, Mia A, PA-C       Current Outpatient Medications  Medication Sig Dispense Refill   amphetamine-dextroamphetamine (ADDERALL XR) 15 MG 24 hr capsule Take 1 capsule by mouth every morning. 30 capsule 0   PARoxetine (PAXIL) 40 MG tablet TAKE 1 TABLET BY MOUTH (40MG  TOTAL) EVERY MORNING 90 tablet 1   PTA Medications: (Not in a hospital admission)   Musculoskeletal: Strength & Muscle Tone: within normal limits Gait & Station: normal Patient leans: N/A  Psychiatric Specialty Exam:  Presentation  General Appearance:  Casual Eye Contact: Fair Speech: Clear and Coherent; Slow Speech Volume: Normal Handedness: No data recorded  Mood and Affect  Mood: Euthymic Affect: Flat; Congruent  Thought Process  Thought Processes: Goal Directed Descriptions of Associations:Intact Orientation:Full (Time, Place and Person) Thought Content:Logical History of Schizophrenia/Schizoaffective disorder:No  Duration of Psychotic Symptoms:No data recorded Hallucinations:Hallucinations: None Ideas of Reference:None Suicidal Thoughts:Suicidal Thoughts: No Homicidal Thoughts:Homicidal Thoughts: No  Sensorium  Memory: Immediate Fair; Recent Fair; Remote Fair Judgment: Fair Insight: Fair; Shallow  Executive Functions  Concentration: Fair Attention  Span: Fair Recall: of Knowledge: Fair Language: Fair  Psychomotor Activity  Psychomotor Activity: Psychomotor Activity: Normal  Assets  Assets: Physical Health; Resilience; Social Support; Housing; [employer]; Vocational/Educational  Sleep  Sleep: Sleep: Fair   Physical Exam: Physical Exam Vitals and nursing note reviewed.  Constitutional:      General: He is not in acute distress.    Appearance: He is normal weight. He is not ill-appearing, toxic-appearing or diaphoretic.  HENT:     Head: Normocephalic.     Nose: Nose normal.     Mouth/Throat:     Mouth: Mucous membranes are moist.     Pharynx: Oropharynx is clear.  Eyes:     Pupils: Pupils are equal, round, and reactive to light.  Cardiovascular:     Rate and Rhythm: Normal rate.     Pulses: Normal pulses.  Pulmonary:     Effort: Pulmonary effort is normal.  Musculoskeletal:        General: Normal range of motion.     Cervical back: Normal range of motion.  Skin:  General: Skin is warm.  Neurological:     General: No focal deficit present.     Mental Status: He is alert.  Psychiatric:        Attention and Perception: Attention and perception normal.        Mood and Affect: Mood and affect normal.        Speech: Speech normal.        Behavior: Behavior is cooperative.        Thought Content: Thought content is not paranoid or delusional. Thought content does not include homicidal or suicidal ideation. Thought content does not include homicidal or suicidal plan.        Cognition and Memory: Memory normal.        Judgment: Judgment normal.   Review of Systems  Constitutional:  Negative for diaphoresis.  Psychiatric/Behavioral:  Positive for substance abuse. Negative for memory loss and suicidal ideas.   All other systems reviewed and are negative. Blood pressure (!) 138/99, pulse 86, temperature 98.2 F (36.8 C), temperature source Oral, resp. rate 16, SpO2 95 %. There is no  height or weight on file to calculate BMI.   Demographic Factors:  Male, Adolescent or young adult, and Caucasian  Loss Factors: Loss of significant relationship  Historical Factors: NA  Risk Reduction Factors:   Sense of responsibility to family and Positive social support  Continued Clinical Symptoms:  Alcohol/Substance Abuse/Dependencies  Cognitive Features That Contribute To Risk:  None    Suicide Risk:  Mild:  Suicidal ideation of limited frequency, intensity, duration, and specificity.  There are no identifiable plans, no associated intent, mild dysphoria and related symptoms, good self-control (both objective and subjective assessment), few other risk factors, and identifiable protective factors, including available and accessible social support.   Plan Of Care/Follow-up recommendations:  Other:  Follow-up with outpatient psychiatric provider for continued care; Resources for local alcohol and substance abuse treatment centers placed in AVS for individual follow-up.   Disposition: Patient was accepted to [facility] for MDD/alcohol dependency; declined admission and is requesting to be discharged. Patient is voluntary; last CIWA score 0. To be discharged with outpatient resources for individual follow-up.  Loletta Parish, NP 11/15/2020, 1:47 PM

## 2020-11-15 NOTE — ED Provider Notes (Signed)
[facility]-EMERGENCY DEPT Provider Note   CSN: [id number] Arrival date & time: 11/14/20  1744     History Chief Complaint  Patient presents with   Alcohol Intoxication    Hunter Dawson is a 25 y.o. male with a history of ADHD and anxiety who presents to the emergency department from [facility] for medical clearance.  The patient reports that he has a longstanding history of alcohol use disorder.  Reports that he has been on " an alcohol bender' for the last 2 days.  He denies SI, HI, or auditory visual hallucinations.  He has no other complaints at this time including chest pain, shortness of breath, nausea, vomiting, diarrhea.  Patient reports to previous behavioral health admissions.  He denies illicit or recreational substance use.  Reports that he does not drink alcohol every day.  Per chart review, patient presented to [facility] voluntarily with his mother for concern for alcohol use disorder, depression, and suicidal thoughts.  Patient was actively intoxicated was not able to contribute to assessment previously.  Mother is very concerned that he has been "drinking nonstop" over the last few days.  She has stated that today he called her and said he was " ready for help".  Note indicates a recent break-up with his current pregnant girlfriend who is due in October.  However, patient reports to me that if he has broken up with his girlfriend he is unaware of it.  No other complaints at this time.  No history of DTs or complicated alcohol withdrawal.  The history is provided by the patient and medical records. No language interpreter was used.      Past Medical History:  Diagnosis Date   ADHD (attention deficit hyperactivity disorder)    Treated with Adderall in middle school. None since that time. did not like the medicaiton   Anxiety     Patient Active Problem List   Diagnosis Date Noted   Severe recurrent major depression without psychotic features (HCC)  01/08/2019   Alcohol-induced mood disorder (HCC) 01/08/2019   Need for Tdap vaccination 04/17/2018   Alcohol abuse 03/29/2018   Hypertension 03/29/2018   Visit for preventive health examination 08/17/2017   ADD (attention deficit disorder) 02/18/2013   Generalized anxiety disorder 02/18/2013    Past Surgical History:  Procedure Laterality Date   PERCUTANEOUS PINNING Right 06/13/2016   Procedure: CLOSED REDUCTION RIGHT HAND;  Surgeon: Knute Neu, MD;  Location: [facility] OR;  Service: Plastics;  Laterality: Right;       Family History  Problem Relation Age of Onset   Arthritis Father    Heart attack Father    Carpal tunnel syndrome Father    Cancer Paternal Grandfather     Social History   Tobacco Use   Smoking status: Never   Smokeless tobacco: Current   Tobacco comments:    Dip  Vaping Use   Vaping Use: Former  Substance Use Topics   Alcohol use: Yes   Drug use: Yes    Types: Marijuana    Home Medications Prior to Admission medications   Medication Sig Start Date End Date Taking? Authorizing Provider  amphetamine-dextroamphetamine (ADDERALL XR) 15 MG 24 hr capsule Take 1 capsule by mouth every morning. 06/08/19   Waldon Merl, PA-C  PARoxetine (PAXIL) 40 MG tablet TAKE 1 TABLET BY MOUTH (40MG  TOTAL) EVERY MORNING 06/19/19   06/21/19, PA-C    Allergies    Lactose intolerance (gi)  Review of Systems  Review of Systems  Constitutional:  Negative for appetite change, chills and fever.  HENT:  Negative for congestion and sore throat.   Respiratory:  Negative for shortness of breath.   Cardiovascular:  Negative for chest pain.  Gastrointestinal:  Negative for abdominal pain, diarrhea, nausea and vomiting.  Genitourinary:  Negative for dysuria.  Musculoskeletal:  Negative for back pain, myalgias, neck pain and neck stiffness.  Skin:  Negative for rash and wound.  Allergic/Immunologic: Negative for immunocompromised state.  Neurological:  Negative for  seizures, syncope, weakness, numbness and headaches.  Psychiatric/Behavioral:  Negative for confusion, hallucinations, self-injury and suicidal ideas. The patient is not hyperactive.    Physical Exam Updated Vital Signs BP (!) 113/95 (BP Location: Left Arm)   Pulse (!) 108   Temp 98.2 F (36.8 C) (Oral)   Resp 16   SpO2 98%   Physical Exam Vitals and nursing note reviewed.  Constitutional:      General: He is not in acute distress.    Appearance: He is well-developed. He is not ill-appearing, toxic-appearing or diaphoretic.  HENT:     Head: Normocephalic.  Eyes:     Conjunctiva/sclera: Conjunctivae normal.  Cardiovascular:     Rate and Rhythm: Normal rate and regular rhythm.     Heart sounds: No murmur heard. Pulmonary:     Effort: Pulmonary effort is normal. No respiratory distress.     Breath sounds: No stridor. No wheezing, rhonchi or rales.  Chest:     Chest wall: No tenderness.  Abdominal:     General: There is no distension.     Palpations: Abdomen is soft. There is no mass.     Tenderness: There is no abdominal tenderness. There is no right CVA tenderness, left CVA tenderness, guarding or rebound.     Hernia: No hernia is present.  Musculoskeletal:     Cervical back: Neck supple.  Skin:    General: Skin is warm and dry.  Neurological:     Mental Status: He is alert.  Psychiatric:        Attention and Perception: He does not perceive auditory or visual hallucinations.        Behavior: Behavior normal.        Thought Content: Thought content does not include homicidal or suicidal ideation. Thought content does not include homicidal or suicidal plan.    ED Results / Procedures / Treatments   Labs (all labs ordered are listed, but only abnormal results are displayed) Labs Reviewed  COMPREHENSIVE METABOLIC PANEL - Abnormal; Notable for the following components:      Result Value   Sodium 148 (*)    Glucose, Bld 107 (*)    All other components within normal  limits  ETHANOL - Abnormal; Notable for the following components:   Alcohol, Ethyl (B) 269 (*)    All other components within normal limits  SALICYLATE LEVEL - Abnormal; Notable for the following components:   Salicylate Lvl <7.0 (*)    All other components within normal limits  ACETAMINOPHEN LEVEL - Abnormal; Notable for the following components:   Acetaminophen (Tylenol), Serum <10 (*)    All other components within normal limits  RESP PANEL BY RT-PCR (FLU A&B, COVID) ARPGX2  CBC  RAPID URINE DRUG SCREEN, HOSP PERFORMED    EKG None  Radiology No results found.  Procedures Procedures   Medications Ordered in ED Medications  LORazepam (ATIVAN) injection 0-4 mg (has no administration in time range)    Or  LORazepam (  ATIVAN) tablet 0-4 mg (has no administration in time range)  LORazepam (ATIVAN) injection 0-4 mg (has no administration in time range)    Or  LORazepam (ATIVAN) tablet 0-4 mg (has no administration in time range)  thiamine tablet 100 mg (has no administration in time range)    Or  thiamine (B-1) injection 100 mg (has no administration in time range)  alum & mag hydroxide-simeth (MAALOX/MYLANTA) 200-200-20 MG/5ML suspension 30 mL (has no administration in time range)  ondansetron (ZOFRAN) tablet 4 mg (has no administration in time range)    ED Course  I have reviewed the triage vital signs and the nursing notes.  Pertinent labs & imaging results that were available during my care of the patient were reviewed by me and considered in my medical decision making (see chart for details).    MDM Rules/Calculators/A&P                           25 year old male with a history of ADHD and anxiety who presents the emergency department from [facility] for medical clearance.  Patient is intoxicated on arrival to the ED with ethanol level of 269.  Labs are otherwise unremarkable.  Patient denies SI, HI, or auditory visual hallucinations at this time.  He remains voluntary.   He is in agreement with work-up and assessment.  On reevaluation, patient has no evidence of alcohol withdrawal at this time.  CIWA orders have been placed.  Pt medically cleared at this time. Psych hold orders and home med orders placed. TTS consult pending; please see psych team notes for further documentation of care/dispo. Pt stable at time of med clearance.     Final Clinical Impression(s) / ED Diagnoses Final diagnoses:  Alcoholic intoxication without complication [facility])    Rx / DC Orders ED Discharge Orders     None        Barkley Boards, PA-C 11/15/20 0525    Koleen Distance, MD 11/15/20 [phone]

## 2020-11-15 NOTE — Discharge Instructions (Signed)
[facility]-will provide timely access to mental health services for children and adolescents (4-17) and adults presenting in a mental health crisis. The program is designed for those who need urgent Behavioral Health or Substance Use treatment and are not experiencing a medical crisis that would typically require an emergency room visit.    [address] [place], [place] [address] Phone: [phone] [email or web address]   The [facility] will also offer the following outpatient services: (Monday through Friday 8am-5pm)    Partial Hospitalization Program (PHP)  Substance Abuse Intensive Outpatient Program (SA-IOP)  Group Therapy  Medication Management  Peer Living Room   We also provide (24/7):    Assessments: Our mental health clinician and providers will conduct a focused mental health evaluation, assessing for immediate safety concerns and further mental health needs.   Referral: Our team will provide resources and help connect to community based mental health treatment, when indicated, including psychotherapy, psychiatry, and other specialized behavioral health or substance use disorder services (for those not already in treatment).   Transitional Care: Our team providers in person bridging and/or telphonic follow-up during the patient's transition to outpatient services.
